# Patient Record
Sex: Male | Born: 1960 | Race: White | Hispanic: No | Marital: Married | Smoking: Never smoker
Health system: Southern US, Community
[De-identification: ages and names within clinical notes are randomized; demographics above are authoritative.]

## PROBLEM LIST (undated history)

## (undated) DIAGNOSIS — I2510 Atherosclerotic heart disease of native coronary artery without angina pectoris: Secondary | ICD-10-CM

## (undated) DIAGNOSIS — R51 Headache: Secondary | ICD-10-CM

## (undated) DIAGNOSIS — Z9289 Personal history of other medical treatment: Secondary | ICD-10-CM

## (undated) DIAGNOSIS — I471 Supraventricular tachycardia, unspecified: Secondary | ICD-10-CM

## (undated) DIAGNOSIS — I214 Non-ST elevation (NSTEMI) myocardial infarction: Secondary | ICD-10-CM

## (undated) DIAGNOSIS — R011 Cardiac murmur, unspecified: Secondary | ICD-10-CM

## (undated) DIAGNOSIS — I209 Angina pectoris, unspecified: Secondary | ICD-10-CM

## (undated) DIAGNOSIS — I10 Essential (primary) hypertension: Secondary | ICD-10-CM

## (undated) HISTORY — PX: COLONOSCOPY: SHX174

## (undated) HISTORY — DX: Non-ST elevation (NSTEMI) myocardial infarction: I21.4

## (undated) HISTORY — PX: OTHER SURGICAL HISTORY: SHX169

## (undated) HISTORY — DX: Atherosclerotic heart disease of native coronary artery without angina pectoris: I25.10

## (undated) HISTORY — DX: Personal history of other medical treatment: Z92.89

## (undated) HISTORY — PX: CARDIAC CATHETERIZATION: SHX172

---

## 2009-07-21 ENCOUNTER — Ambulatory Visit: Payer: Self-pay | Admitting: Radiology

## 2009-07-21 ENCOUNTER — Emergency Department (HOSPITAL_BASED_OUTPATIENT_CLINIC_OR_DEPARTMENT_OTHER): Admission: EM | Admit: 2009-07-21 | Discharge: 2009-07-21 | Payer: Self-pay | Admitting: Emergency Medicine

## 2010-10-30 LAB — URINE MICROSCOPIC-ADD ON

## 2010-10-30 LAB — URINALYSIS, ROUTINE W REFLEX MICROSCOPIC
Glucose, UA: NEGATIVE mg/dL
Leukocytes, UA: NEGATIVE
Nitrite: NEGATIVE
Specific Gravity, Urine: 1.025 (ref 1.005–1.030)
pH: 5.5 (ref 5.0–8.0)

## 2012-06-26 ENCOUNTER — Inpatient Hospital Stay (HOSPITAL_BASED_OUTPATIENT_CLINIC_OR_DEPARTMENT_OTHER)
Admission: EM | Admit: 2012-06-26 | Discharge: 2012-06-27 | DRG: 125 | Disposition: A | Discharge status: Other Acute Inpatient Hospital | Payer: Federal, State, Local not specified - PPO | Attending: Emergency Medicine | Admitting: Emergency Medicine

## 2012-06-26 ENCOUNTER — Emergency Department (HOSPITAL_BASED_OUTPATIENT_CLINIC_OR_DEPARTMENT_OTHER): Payer: Federal, State, Local not specified - PPO

## 2012-06-26 ENCOUNTER — Encounter (HOSPITAL_BASED_OUTPATIENT_CLINIC_OR_DEPARTMENT_OTHER): Payer: Self-pay | Admitting: Emergency Medicine

## 2012-06-26 DIAGNOSIS — I471 Supraventricular tachycardia: Secondary | ICD-10-CM

## 2012-06-26 DIAGNOSIS — I498 Other specified cardiac arrhythmias: Secondary | ICD-10-CM

## 2012-06-26 DIAGNOSIS — I10 Essential (primary) hypertension: Secondary | ICD-10-CM | POA: Diagnosis present

## 2012-06-26 DIAGNOSIS — I214 Non-ST elevation (NSTEMI) myocardial infarction: Secondary | ICD-10-CM

## 2012-06-26 DIAGNOSIS — I2510 Atherosclerotic heart disease of native coronary artery without angina pectoris: Principal | ICD-10-CM | POA: Diagnosis present

## 2012-06-26 DIAGNOSIS — R002 Palpitations: Secondary | ICD-10-CM | POA: Diagnosis present

## 2012-06-26 DIAGNOSIS — R61 Generalized hyperhidrosis: Secondary | ICD-10-CM | POA: Diagnosis present

## 2012-06-26 DIAGNOSIS — R079 Chest pain, unspecified: Secondary | ICD-10-CM

## 2012-06-26 DIAGNOSIS — Z8679 Personal history of other diseases of the circulatory system: Secondary | ICD-10-CM

## 2012-06-26 HISTORY — DX: Supraventricular tachycardia: I47.1

## 2012-06-26 HISTORY — DX: Cardiac murmur, unspecified: R01.1

## 2012-06-26 HISTORY — DX: Essential (primary) hypertension: I10

## 2012-06-26 HISTORY — DX: Supraventricular tachycardia, unspecified: I47.10

## 2012-06-26 LAB — COMPREHENSIVE METABOLIC PANEL
ALT: 12 U/L (ref 0–53)
Albumin: 4.4 g/dL (ref 3.5–5.2)
Calcium: 9.5 mg/dL (ref 8.4–10.5)
GFR calc Af Amer: 80 mL/min — ABNORMAL LOW (ref >90)
Glucose, Bld: 153 mg/dL — ABNORMAL HIGH (ref 70–99)
Sodium: 140 mEq/L (ref 135–145)
Total Protein: 7.8 g/dL (ref 6.0–8.3)

## 2012-06-26 LAB — TROPONIN I
Troponin I: <0.3 ng/mL (ref <0.30)
Troponin I: 1.95 ng/mL (ref <0.30)
Troponin I: 1.58 ng/mL (ref <0.30)

## 2012-06-26 LAB — TSH: TSH: 0.974 u[IU]/mL (ref 0.350–4.500)

## 2012-06-26 LAB — CBC
Hemoglobin: 15.8 g/dL (ref 13.0–17.0)
MCH: 30.7 pg (ref 26.0–34.0)
MCHC: 35.7 g/dL (ref 30.0–36.0)
Platelets: 269 10*3/uL (ref 150–400)
RDW: 12.1 % (ref 11.5–15.5)

## 2012-06-26 LAB — HEPARIN LEVEL (UNFRACTIONATED): Heparin Unfractionated: 0.37 IU/mL (ref 0.30–0.70)

## 2012-06-26 LAB — MAGNESIUM: Magnesium: 2.2 mg/dL (ref 1.5–2.5)

## 2012-06-26 MED ORDER — ADENOSINE 6 MG/2ML IV SOLN
INTRAVENOUS | Status: AC
  Administered 2012-06-26: 6 mg via INTRAVENOUS
  Filled 2012-06-26: qty 6

## 2012-06-26 MED ORDER — ONDANSETRON HCL 4 MG/2ML IJ SOLN: 4.0000 mg | Freq: Four times a day (QID) | INTRAMUSCULAR | Status: DC | PRN

## 2012-06-26 MED ORDER — HEPARIN (PORCINE) IN NACL 100-0.45 UNIT/ML-% IJ SOLN
12.0000 [IU]/kg/h | Freq: Once | INTRAMUSCULAR | Status: AC
  Administered 2012-06-26: 12 [IU]/kg/h via INTRAVENOUS
  Filled 2012-06-26: qty 250

## 2012-06-26 MED ORDER — ASPIRIN 81 MG PO CHEW: 81.0000 mg | CHEWABLE_TABLET | Freq: Every day | ORAL | Status: DC

## 2012-06-26 MED ORDER — METOPROLOL TARTRATE 25 MG PO TABS
25.0000 mg | ORAL_TABLET | Freq: Two times a day (BID) | ORAL | Status: DC
  Administered 2012-06-26 – 2012-06-27 (×3): 25 mg via ORAL
  Filled 2012-06-26 (×4): qty 1

## 2012-06-26 MED ORDER — METOPROLOL TARTRATE 25 MG PO TABS: 25.0000 mg | ORAL_TABLET | Freq: Two times a day (BID) | ORAL | Status: DC

## 2012-06-26 MED ORDER — ASPIRIN 81 MG PO CHEW
324.0000 mg | CHEWABLE_TABLET | Freq: Once | ORAL | Status: AC
  Administered 2012-06-26: 324 mg via ORAL
  Filled 2012-06-26: qty 4

## 2012-06-26 MED ORDER — HEPARIN BOLUS VIA INFUSION
4000.0000 [IU] | Freq: Once | INTRAVENOUS | Status: AC
  Administered 2012-06-26: 4000 [IU] via INTRAVENOUS

## 2012-06-26 MED ORDER — SODIUM CHLORIDE 0.9 % IJ SOLN: 3.0000 mL | INTRAMUSCULAR | Status: DC | PRN

## 2012-06-26 MED ORDER — SODIUM CHLORIDE 0.9 % IV SOLN: 250.0000 mL | INTRAVENOUS | Status: DC | PRN

## 2012-06-26 MED ORDER — NITROGLYCERIN 0.4 MG SL SUBL: 0.4000 mg | SUBLINGUAL_TABLET | SUBLINGUAL | Status: DC | PRN

## 2012-06-26 MED ORDER — SODIUM CHLORIDE 0.9 % IJ SOLN: 3.0000 mL | Freq: Two times a day (BID) | INTRAMUSCULAR | Status: DC

## 2012-06-26 MED ORDER — ASPIRIN 81 MG PO CHEW
324.0000 mg | CHEWABLE_TABLET | ORAL | Status: AC
  Administered 2012-06-27: 324 mg via ORAL
  Filled 2012-06-26: qty 4

## 2012-06-26 MED ORDER — METOPROLOL TARTRATE 50 MG PO TABS: 25.0000 mg | ORAL_TABLET | Freq: Two times a day (BID) | ORAL | Status: DC

## 2012-06-26 MED ORDER — ACETAMINOPHEN 325 MG PO TABS: 650.0000 mg | ORAL_TABLET | ORAL | Status: DC | PRN

## 2012-06-26 MED ORDER — ADENOSINE 6 MG/2ML IV SOLN
6.0000 mg | Freq: Once | INTRAVENOUS | Status: AC
  Administered 2012-06-26: 6 mg via INTRAVENOUS

## 2012-06-26 MED ORDER — SODIUM CHLORIDE 0.9 % IV SOLN
1.0000 mL/kg/h | INTRAVENOUS | Status: DC
Start: 2012-06-27 — End: 2012-06-27
  Administered 2012-06-27 (×2): 1 mL/kg/h via INTRAVENOUS

## 2012-06-26 MED ORDER — ASPIRIN EC 81 MG PO TBEC
81.0000 mg | DELAYED_RELEASE_TABLET | Freq: Every day | ORAL | Status: DC
  Filled 2012-06-26: qty 1

## 2012-06-26 MED ORDER — HEPARIN (PORCINE) IN NACL 100-0.45 UNIT/ML-% IJ SOLN
12.0000 [IU]/kg/h | INTRAMUSCULAR | Status: DC
  Administered 2012-06-26 – 2012-06-27 (×2): 12 [IU]/kg/h via INTRAVENOUS
  Filled 2012-06-26 (×3): qty 250

## 2012-06-26 NOTE — Progress Notes (Signed)
ANTICOAGULATION CONSULT NOTE - Initial Consult  Pharmacy Consult for heparin Indication: chest pain/ACS  No Known Allergies  Patient Measurements: Height: 5\' 11"  (180.3 cm) Weight: 175 lb 10.6 oz (79.68 kg) IBW/kg (Calculated) : 75.3  Heparin Dosing Weight: 80 kg  Vital Signs: Temp: 97.9 F (36.6 C) (11/28 1100) Temp src: Oral (11/28 1100) BP: 131/91 mmHg (11/28 1100) Pulse Rate: 94  (11/28 1100)  Labs:  Basename 06/26/12 0905 06/26/12 0605  HGB -- 15.8  HCT -- 44.2  PLT -- 269  APTT -- --  LABPROT -- --  INR -- --  HEPARINUNFRC -- --  CREATININE -- 1.20  CKTOTAL -- --  CKMB -- --  TROPONINI 0.49* <0.30    Estimated Creatinine Clearance: 79.3 ml/min (by C-G formula based on Cr of 1.2).   Medical History: Past Medical History  Diagnosis Date  . Tachycardia   . Heart murmur   . Hypertension     per pt dx 20 years but has not had any meds in 20 yrs per md    Medications:  Scheduled:    . [COMPLETED] adenosine (ADENOCARD) IV  6 mg Intravenous Once  . [COMPLETED] aspirin  324 mg Oral Once  . aspirin EC  81 mg Oral Daily  . [COMPLETED] heparin  12 Units/kg/hr Intravenous Once  . [COMPLETED] heparin  4,000 Units Intravenous Once  . metoprolol tartrate  25 mg Oral BID  . [DISCONTINUED] metoprolol tartrate  25 mg Oral BID   Infusions:    Assessment: 51 yo male admitted with chest pain. Was at [facility] and was given a bolus of heparin 4000 units iv x1 at ~1035 and started on the drip at 12 units/kg/hr. H/H 15.8/44.2; Plt 269; troponin 0.49.    Goal of Therapy:  Heparin level 0.3-0.7 units/ml Monitor platelets by anticoagulation protocol: Yes   Plan:  1) Continue heparin drip at 12 units/kg/hr since since the patient has been on heparin drip for almost 4 hours 2) Check a heparin drip at 1630 today 3) Daily heparin level and CBC    Yechiel Erny, Tsz-Yin 06/26/2012,2:03 PM

## 2012-06-26 NOTE — Progress Notes (Signed)
ANTICOAGULATION CONSULT NOTE - Initial Consult  Pharmacy Consult for heparin Indication: chest pain/ACS  No Known Allergies  Patient Measurements: Height: 5\' 11"  (180.3 cm) Weight: 175 lb 10.6 oz (79.68 kg) IBW/kg (Calculated) : 75.3  Heparin Dosing Weight: 80 kg  Vital Signs: Temp: 97.9 F (36.6 C) (11/28 1100) Temp src: Oral (11/28 1100) BP: 115/70 mmHg (11/28 1515) Pulse Rate: 108  (11/28 1515)  Labs:  Basename 06/26/12 1603 06/26/12 1430 06/26/12 0905 06/26/12 0605  HGB -- -- -- 15.8  HCT -- -- -- 44.2  PLT -- -- -- 269  APTT -- -- -- --  LABPROT -- -- -- --  INR -- -- -- --  HEPARINUNFRC 0.37 -- -- --  CREATININE -- -- -- 1.20  CKTOTAL -- -- -- --  CKMB -- -- -- --  TROPONINI -- 1.95* 0.49* <0.30    Estimated Creatinine Clearance: 79.3 ml/min (by C-G formula based on Cr of 1.2).   Medical History: Past Medical History  Diagnosis Date  . Tachycardia   . Heart murmur   . Hypertension     per pt dx 20 years but has not had any meds in 20 yrs per md    Medications:  Scheduled:     . [COMPLETED] adenosine (ADENOCARD) IV  6 mg Intravenous Once  . [COMPLETED] aspirin  324 mg Oral Once  . aspirin EC  81 mg Oral Daily  . [COMPLETED] heparin  12 Units/kg/hr Intravenous Once  . [COMPLETED] heparin  4,000 Units Intravenous Once  . metoprolol tartrate  25 mg Oral BID  . [DISCONTINUED] metoprolol tartrate  25 mg Oral BID   Infusions:     . heparin 12 Units/kg/hr (06/26/12 1516)    Assessment: 51 yo male admitted with chest pain likely due to SVT. He is on IV heparin and the 6 hr heparin level is therapeutic on 950 units/hr. Pending decision on stress test vs. cardiac cath. No bleeding noted per chart.  Goal of Therapy:  Heparin level 0.3-0.7 units/ml Monitor platelets by anticoagulation protocol: Yes   Plan:  1) Continue heparin drip at 950 units/hr 2) f/u AM heparin level and CBC 3) f/u plans of treatment.   Bayard Hugger, PharmD, BCPS  Clinical  Pharmacist  Pager: [phone]  06/26/2012,5:59 PM

## 2012-06-26 NOTE — H&P (Addendum)
History and Physical  Patient ID: Julian White MRN: [id number], SOB: Feb 22, 1961 51 y.o. Date of Encounter: 06/26/2012, 6:40 PM  Primary Physician: No primary provider on file. Primary Cardiologist: Nicholes Mango, MD (new)   Chief Complaint: Tachypalpitations, chest pain   HPI: 51 year old male, with no prior history of heart disease, who initially presented to [facility] ER with complaint of palpitations and chest pain. He was found to be in SVT rhythm at approximately 190 bpm (EKG currently unavailable), and was successfully treated with 6 mg of adenosine.   He awoke at 3 AM this morning with sensation of heart racing, but also with severe CP (6/10), with associated dyspnea, diaphoresis, and lightheadedness. He reported some radiation to the back and right shoulder, but not down the left arm. He presented with no definite ST changes on EKG; however, cardiac markers notable for a troponin I of 0.49. He was treated with 4 baby ASA and started on IV heparin.  Patient presents with several year history of occasional tachycardia palpitations, occurring every 3-4 months. He has noted some mild associated CP with these episodes, but never as intense as what he experienced this morning. He also presents with no antecedent history of exertional CP or DOE.  Patient denies any prior formal cardiac evaluation or diagnostic testing.   Past Medical History  Diagnosis Date  . Tachycardia   . Heart murmur   . Hypertension     per pt dx 20 years but has not had any meds in 20 yrs per md      Surgical History: History reviewed. No pertinent past surgical history.   Home Meds: Prior to Admission medications   Medication Sig Start Date End Date Taking? Authorizing Provider  aspirin 81 MG chewable tablet Chew 1 tablet (81 mg total) by mouth daily. 06/26/12   Tobin Chad, MD  metoprolol (LOPRESSOR) 50 MG tablet Take 0.5 tablets (25 mg total) by mouth 2 (two) times daily. 06/26/12   Tobin Chad, MD    Allergies: No Known Allergies  History   Social History  . Marital Status: Married    Spouse Name: N/A    Number of Children: N/A  . Years of Education: N/A   Occupational History  . Not on file.   Social History Main Topics  . Smoking status: Never Smoker   . Smokeless tobacco: Not on file  . Alcohol Use: No  . Drug Use:   . Sexually Active:    Other Topics Concern  . Not on file   Social History Narrative  . No narrative on file     Family History  Problem Relation Age of Onset  . CAD Brother     s/p MI in his 62s  . CAD Father     s/p CABG at 2    Review of Systems: General: negative for chills, fever, night sweats or weight changes.  Cardiovascular: negative for chest pain, edema, orthopnea, palpitations, paroxysmal nocturnal dyspnea, shortness of breath or dyspnea on exertion Dermatological: negative for rash Respiratory: negative for cough or wheezing Urologic: negative for hematuria Abdominal: negative for nausea, vomiting, diarrhea, bright red blood per rectum, melena, or hematemesis Neurologic: negative for visual changes, syncope, or dizziness All other systems reviewed and are otherwise negative except as noted above.  Labs:   Lab Results  Component Value Date   WBC 11.7* 06/26/2012   HGB 15.8 06/26/2012   HCT 44.2 06/26/2012   MCV 85.8 06/26/2012   PLT 269  06/26/2012     Lab 06/26/12 0605  NA 140  K 3.8  CL 101  CO2 28  BUN 19  CREATININE 1.20  CALCIUM 9.5  PROT 7.8  BILITOT 0.5  ALKPHOS 102  ALT 12  AST 18  GLUCOSE 153*    Basename 06/26/12 1430 06/26/12 0905 06/26/12 0605  CKTOTAL -- -- --  CKMB -- -- --  TROPONINI 1.95* 0.49* <0.30   No results found for this basename: CHOL,  HDL,  LDLCALC,  TRIG   No results found for this basename: DDIMER    Radiology/Studies:  Dg Chest 2 View  06/26/2012  *RADIOLOGY REPORT*  Clinical Data: Chest pain and tachycardia; shortness of breath.  CHEST - 2 VIEW   Comparison: None.  Findings: The lungs are well-aerated and clear.  There is no evidence of focal opacification, pleural effusion or pneumothorax.  The heart is normal in size; the mediastinal contour is within normal limits.  No acute osseous abnormalities are seen.  IMPRESSION: No acute cardiopulmonary process seen.   Original Report Authenticated By: Tonia Ghent, M.D.      EKG: NSR 87 bpm; normal axis; no ischemic changes, on arrival  Physical Exam:  General: 51 yom; Well developed, well nourished, in no acute distress. Head: Normocephalic, atraumatic, sclera non-icteric, no xanthomas, nares are without discharge.  Neck: Negative for carotid bruits. JVD not elevated. Lungs: Clear bilaterally to auscultation without wheezes, rales, or rhonchi. Breathing is unlabored. Heart: RRR with S1 S2. No murmurs, rubs, or gallops appreciated. Abdomen: Soft, non-tender, non-distended with normoactive bowel sounds. No hepatomegaly. No rebound/guarding. No obvious abdominal masses. Msk:  Strength and tone appear normal for age. Extremities: No clubbing or cyanosis. No edema.  Distal pedal pulses are 2+ and equal bilaterally. Neuro: Alert and oriented X 3. Moves all extremities spontaneously. Psych:  Responds to questions appropriately with a normal affect.     ASSESSMENT AND PLAN:  1 SVT  - Successfully treated with 6 mg adenosine   - longstanding h/o tachypalpitaions  2 NSTEMI (type II)  - in setting of above  3 Multiple CRFs  - h/o HTN  - FH  - age  PLAN: Patient will be admitted for continued close monitoring on telemetry, to rule out recurrent tachyarrhythmia. Serial cardiac markers will be cycled. Will check TSH and Mg levels. FLP in AM. Medical therapy to consist of ASA, beta blocker, and IV heparin. We will order a 2-D echocardiogram for assessment of LVF, and rule out of underlying structural abnormalities. We'll request a formal EP evaluation regarding feasibility of RF ablation. Of  note, patient to consider the option of a stress test for risk stratification, versus a diagnostic cardiac catheterization for definitive exclusion of CAD.  Signed, SERPE, EUGENE PA-C 06/26/2012, 6:40 PM   Patient seen and examined independently. Gene Serpe, PA-C note reviewed carefully - agree with his assessment and plan. I have edited the note based on my findings.   51 y/o male with long h/o of palpitations/SVT. Now admitted with recurrent, prolonged episode of SVT with HR 190 associated with demand NSTEMI likely due to high rate of SVT. At baseline, very active without ischemic sx. Discussed need for echo and ablation. Given NSTEMI, we also discussed role of further testing (stress testing, cath, cardiac CT) for underlying CAD particularly in light of his FHX with brother who had MI in 16s. We discussed risks and advantages of each modality. Currently he is leaning toward cath but will discuss with his family prior  to making a final decision. Agree with ASA, b-blocker and heparin. Once echo and ischemic evaluation complete can be discharged and return for ablation as outpatient.   Anayia Eugene,MD 8:38 PM   Addendum - f/u troponin now 1.9. Patient and family wanting to pursue cardiac catheterization. Will arrange for tomorrow.   Truman Hayward 8:39 PM

## 2012-06-26 NOTE — ED Notes (Signed)
2956 6mg  adenosine pushed, pt sitting in bed alert and oriented, follows commands, procedure explained , meds pushed, flushed with ns, pt hr decreased to 110-120, pt alert and oriented, tolerated well

## 2012-06-26 NOTE — ED Notes (Signed)
Pt awoke around 3 am with left sided chest pain the radiates to back of neck

## 2012-06-26 NOTE — ED Notes (Signed)
Pt arrived c/o chest pain that awoke him from his sleep. Upon assesment, pt was noted to be in svt, heart rate of 190-205, pt alert oriented, sitting in bed talking, denies sob, denies n/v, md at bedside

## 2012-06-26 NOTE — ED Notes (Signed)
Sherry with lab called panic troponin 0.49 notified Dr Linwood Dibbles

## 2012-06-26 NOTE — ED Provider Notes (Signed)
History     CSN: [id number]  Arrival date & time 06/26/12  0549   None     Chief Complaint  Patient presents with  . Chest Pain    (Consider location/radiation/quality/duration/timing/severity/associated sxs/prior treatment) HPI Comments: [name] presents with his wife for evaluation.  He reports waking from sleep at about 0300 with severe left-sided chest pain that radiated into his neck and back.  The sharp pain soon subsided into a dull discomfort but it persisted for quite some time.  This prompted him to seek attention.  On arrival to the ER, he reports he can now feel his heart racing.  He denies any shortness of breath and only reports a vague ongoing discomfort.    He denies any prior history of chest pain but has been having palpitations or a racing heart beat intermittently (sporadicallly - every few weeks) for several years.  It has never persisted and he has never sought medical attention related to this condition.  He denies alcohol, caffeine, stimulant, or drug abuse.  He state his father had a CABG and he has 2 brothers that had heart attacks or were diagnosed with CAD in their early 91s.  The history is provided by the patient. No language interpreter was used.    Past Medical History  Diagnosis Date  . Tachycardia   . Heart murmur   . Hypertension     per pt dx 20 years but has not had any meds in 20 yrs per md    History reviewed. No pertinent past surgical history.  History reviewed. No pertinent family history.  History  Substance Use Topics  . Smoking status: Never Smoker   . Smokeless tobacco: Not on file  . Alcohol Use: No      Review of Systems  Constitutional: Positive for diaphoresis. Negative for fever, chills, activity change, appetite change and fatigue.  HENT: Negative.   Eyes: Negative.   Respiratory: Positive for chest tightness. Negative for cough, shortness of breath and wheezing.   Cardiovascular: Positive for palpitations.  Negative for chest pain and leg swelling.  Gastrointestinal: Negative for nausea, vomiting, abdominal pain and diarrhea.  Genitourinary: Negative.   Musculoskeletal: Negative.   Skin: Negative.   Neurological: Negative.   Hematological: Negative.   Psychiatric/Behavioral: Negative.     Allergies  Review of patient's allergies indicates no known allergies.  Home Medications  No current outpatient prescriptions on file.  BP 121/83  Pulse 103  Temp 98 F (36.7 C)  Physical Exam  Nursing note and vitals reviewed. Constitutional: He is oriented to person, place, and time. He appears well-developed and well-nourished. No distress.  HENT:  Head: Normocephalic and atraumatic.  Right Ear: External ear normal.  Nose: Nose normal.  Mouth/Throat: Oropharynx is clear and moist. No oropharyngeal exudate.  Eyes: Conjunctivae normal are normal. Pupils are equal, round, and reactive to light. Right eye exhibits no discharge. Left eye exhibits no discharge. No scleral icterus.  Neck: Normal range of motion. Neck supple. No JVD present. No tracheal deviation present.  Cardiovascular: Regular rhythm, S1 normal, S2 normal, normal heart sounds, intact distal pulses and normal pulses.  Tachycardia present.  Exam reveals no gallop, no distant heart sounds, no friction rub and no decreased pulses.   No murmur heard.      Rhythm was too fast to characterize by auscultation initially.  After adenosine, note RRR [id number].  Pulmonary/Chest: Effort normal and breath sounds normal. No stridor. No respiratory distress. He has no wheezes.  He has no rales. He exhibits no tenderness.  Abdominal: Soft. Bowel sounds are normal. He exhibits no distension and no mass. There is no tenderness. There is no rebound and no guarding.  Lymphadenopathy:    He has no cervical adenopathy.  Neurological: He is alert and oriented to person, place, and time. No cranial nerve deficit.  Skin: Skin is warm and dry. No rash noted. He  is not diaphoretic. No erythema. No pallor.  Psychiatric: He has a normal mood and affect. His behavior is normal.    ED Course  Procedures (including critical care time)   Labs Reviewed  CBC  COMPREHENSIVE METABOLIC PANEL  TROPONIN I   No results found.   No diagnosis found.   Date: 06/26/2012 @ 0601  Rate: 190 bpm  Rhythm: supraventricular tachycardia (SVT)  QRS Axis: normal  Intervals:    ST/T Wave abnormalities: nonspecific ST changes  Conduction Disutrbances:none  Narrative Interpretation:   Old EKG Reviewed: none available   Date: 06/26/2012 @0613  s/p adenosine push x 1  Rate: 94 bpm  Rhythm: sinus  QRS Axis: normal  Intervals: normal  ST/T Wave abnormalities: ST depressions diffusely  Conduction Disutrbances:none  Narrative Interpretation:   Old EKG Reviewed: changes noted        MDM  Pt presents for evaluation of chest pain.  He developed SVT on arrival which has converted to sinus rhythm after administering IVP adenosine.  He reports some vague ongoing discomfort, note stable vital signs currently, NAD.  Pt reports a strong family hx for early CAD that includes 2 brothers and his father with documented CAD in their 34s.  Repeat EKG does show some diffuse ST depressions but this finding could be secondary to the sustained SVT.  He reports having periods of palpitations for several years that have always self-resolved.  Will obtain basic cardiac labs and a chest x-ray.  As the results become available, will contact the hospitalist service for admission for further mgmnt and testing.   0735.  Pt stable, NAD.  He is completely pain free.  CXR and 1st trop are unremarkable.  I discussed his evaluation with the on-call provider for the [facility] cardiology group.  Plan a repeat 3 hr trop and EKG.  If significantly abnormal, he will be admitted for further testing.  If unchanged, will discharge home on metoprolol 25 mg bid and asa 81 mg po daily to follow-up as an  outpyt.  Pt signed over to Dr. Lynelle Doctor pending the 2nd trop at [phone].   CRITICAL CARE Performed by: Dana Allan T   Total critical care time: 35  Critical care time was exclusive of separately billable procedures and treating other patients.  Critical care was necessary to treat or prevent imminent or life-threatening deterioration.  Critical care was time spent personally by me on the following activities: development of treatment plan with patient and/or surrogate as well as nursing, discussions with consultants, evaluation of patient's response to treatment, examination of patient, obtaining history from patient or surrogate, ordering and performing treatments and interventions, ordering and review of laboratory studies, ordering and review of radiographic studies, pulse oximetry and re-evaluation of patient's condition.           Julian Chad, MD 06/26/12 [phone]

## 2012-06-26 NOTE — ED Notes (Signed)
Family at bedside.pt talking to family, denies sob, cont. Back pain

## 2012-06-26 NOTE — ED Provider Notes (Signed)
Repeat EKG   Rate: 87  Rhythm: normal sinus rhythm  QRS Axis: normal  Intervals: normal  ST/T Wave abnormalities: normal  Conduction Disutrbances:none  Narrative Interpretation:   Old EKG Reviewed: none available  Repeat troponin is elevated.  Pt remains symptom free.  Will discuss with [place] cardiology since his troponin level is increasing and he has a strong family history.  10:20 AM Discussed with Dr Dietrich Pates.  Will transfer pt to [place] for admission.  Pt remains symptoms free at this time.  Updated findings with patient and his family.  Celene Kras, MD 06/26/12 1021

## 2012-06-26 NOTE — ED Notes (Signed)
Called carelink to page [facility] per Dr. Lynelle Doctor request.-MT

## 2012-06-26 NOTE — Progress Notes (Signed)
Dr. Gala Romney informed of Troponin 1.95; continuing current orders. Julian White

## 2012-06-27 ENCOUNTER — Encounter (HOSPITAL_COMMUNITY): Payer: Self-pay | Admitting: Internal Medicine

## 2012-06-27 ENCOUNTER — Encounter (HOSPITAL_COMMUNITY)
Admission: EM | Disposition: A | Discharge status: Other Acute Inpatient Hospital | Payer: Self-pay | Source: Home / Self Care

## 2012-06-27 DIAGNOSIS — I214 Non-ST elevation (NSTEMI) myocardial infarction: Secondary | ICD-10-CM

## 2012-06-27 DIAGNOSIS — I471 Supraventricular tachycardia, unspecified: Secondary | ICD-10-CM

## 2012-06-27 DIAGNOSIS — R011 Cardiac murmur, unspecified: Secondary | ICD-10-CM | POA: Insufficient documentation

## 2012-06-27 HISTORY — PX: LEFT HEART CATHETERIZATION WITH CORONARY ANGIOGRAM: SHX5451

## 2012-06-27 LAB — PROTIME-INR
INR: 1.07 (ref 0.00–1.49)
Prothrombin Time: 13.8 seconds (ref 11.6–15.2)

## 2012-06-27 LAB — LIPID PANEL
LDL Cholesterol: 128 mg/dL — ABNORMAL HIGH (ref 0–99)
Total CHOL/HDL Ratio: 4.4 RATIO

## 2012-06-27 LAB — TROPONIN I: Troponin I: 1.51 ng/mL (ref <0.30)

## 2012-06-27 LAB — CBC
HCT: 38.9 % — ABNORMAL LOW (ref 39.0–52.0)
Hemoglobin: 13.2 g/dL (ref 13.0–17.0)
MCH: 29.9 pg (ref 26.0–34.0)
MCHC: 33.9 g/dL (ref 30.0–36.0)

## 2012-06-27 SURGERY — LEFT HEART CATHETERIZATION WITH CORONARY ANGIOGRAM
Anesthesia: LOCAL

## 2012-06-27 MED ORDER — LIDOCAINE HCL (PF) 1 % IJ SOLN
INTRAMUSCULAR | Status: AC
  Filled 2012-06-27: qty 30

## 2012-06-27 MED ORDER — HEPARIN SODIUM (PORCINE) 1000 UNIT/ML IJ SOLN
INTRAMUSCULAR | Status: AC
  Filled 2012-06-27: qty 1

## 2012-06-27 MED ORDER — METOPROLOL TARTRATE 50 MG PO TABS: 50.0000 mg | ORAL_TABLET | ORAL | Status: DC | PRN

## 2012-06-27 MED ORDER — MIDAZOLAM HCL 2 MG/2ML IJ SOLN
INTRAMUSCULAR | Status: AC
  Filled 2012-06-27: qty 2

## 2012-06-27 MED ORDER — FENTANYL CITRATE 0.05 MG/ML IJ SOLN
INTRAMUSCULAR | Status: AC
  Filled 2012-06-27: qty 2

## 2012-06-27 MED ORDER — SIMVASTATIN 20 MG PO TABS
20.0000 mg | ORAL_TABLET | Freq: Every day | ORAL | Status: DC
  Administered 2012-06-27: 20 mg via ORAL
  Filled 2012-06-27: qty 1

## 2012-06-27 MED ORDER — SIMVASTATIN 20 MG PO TABS: 20.0000 mg | ORAL_TABLET | Freq: Every day | ORAL | Status: DC

## 2012-06-27 MED ORDER — HEPARIN (PORCINE) IN NACL 2-0.9 UNIT/ML-% IJ SOLN
INTRAMUSCULAR | Status: AC
  Filled 2012-06-27: qty 1000

## 2012-06-27 MED ORDER — ONDANSETRON HCL 4 MG/2ML IJ SOLN: 4.0000 mg | Freq: Four times a day (QID) | INTRAMUSCULAR | Status: DC | PRN

## 2012-06-27 MED ORDER — SODIUM CHLORIDE 0.9 % IV SOLN
INTRAVENOUS | Status: AC
  Administered 2012-06-27: 09:00:00 via INTRAVENOUS

## 2012-06-27 MED ORDER — ACETAMINOPHEN 325 MG PO TABS: 650.0000 mg | ORAL_TABLET | ORAL | Status: DC | PRN

## 2012-06-27 MED ORDER — MIDAZOLAM HCL 2 MG/2ML IJ SOLN
INTRAMUSCULAR | Status: AC
Start: 2012-06-27 — End: 2012-06-27
  Filled 2012-06-27: qty 2

## 2012-06-27 MED ORDER — NITROGLYCERIN 0.2 MG/ML ON CALL CATH LAB
INTRAVENOUS | Status: AC
  Filled 2012-06-27: qty 1

## 2012-06-27 MED ORDER — VERAPAMIL HCL 2.5 MG/ML IV SOLN
INTRAVENOUS | Status: AC
  Filled 2012-06-27: qty 2

## 2012-06-27 NOTE — Progress Notes (Signed)
Assessment unchanged. Discussed D/C instructions with pt and family. Pt verbalized understanding.  RX given to pt. Tele D/C'd and IV removed. Pt left via foot accompanied by RN.

## 2012-06-27 NOTE — Discharge Summary (Signed)
Discharge Summary   Patient ID: Julian White,  MRN: [id number], DOB/AGE: 1960/10/23 51 y.o.  Admit date: 06/26/2012 Discharge date: 06/27/2012  Primary Physician: No primary provider on file. Primary Cardiologist: Dr. Gala Romney (new)  Discharge Diagnoses Principal Problem:  *NSTEMI (non-ST elevated myocardial infarction) Active Problems:  SVT (supraventricular tachycardia)   Allergies No Known Allergies  Diagnostic Studies/Procedures    PA/LATERAL CHEST X-RAY - 06/26/12  CHEST - 2 VIEW  Comparison: None.  Findings: The lungs are well-aerated and clear. There is no  evidence of focal opacification, pleural effusion or pneumothorax.  The heart is normal in size; the mediastinal contour is within  normal limits. No acute osseous abnormalities are seen.  IMPRESSION:  No acute cardiopulmonary process seen.  LEFT HEART CATHETERIZATION - 06/27/12  Findings:  Ao Pressure:117/85 (101)  LV Pressure: 119/4/8  There was no signficant gradient across the aortic valve on pullback.  Left main: Distal 20%  LAD: Very large. Wraps apex. Proximal and mid 20% tubular lesions with small intramyocardial bridge in the midsection with no flow limitation. Very large branching 1st diagonal  LCX: Co-dominant. Normal  RCA: Co-dominant. Moderate-sized. Very mild luminal plaquing.  LV-gram done in the RAO projection: Ejection fraction = 55% no wall motion abnormalities  Assessment:  1. Very mild luminal CAD  2. Normal LV function   TRANSTHORACIC ECHOCARDIOGRAM - 06/27/12  Left ventricle: Wall thickness was increased in a pattern of mild LVH. Systolic function was vigorous. The estimated ejection fraction was in the range of 65% to 70%.  Hospital Course   Julian White is a 51yo male who was hospitalized at [facility] on 06/26/12 for tachy-palpitations and chest pain. He has no prior history of cardiac disease. He has a history of HLD and family history of premature CAD (brother  MI 12s). He presented to [facility] ED with c/o tachy-palpitations and chest pain. EKG there revealed SVT (rate 190). He was given adenosine 6mg  x 1 with successful restoration to NSR. He awoke shortly after with tachy-palpitations, 6/10 chest pain, dyspnea, diaphoresis and lightheadedness with radiation to his back and R shoulder. He was transferred to [facility] with no definite ST changes on EKG, and a mild troponin-I elevation of 0.49. He was given low-dose ASA x 4 and started on heparin IV. He notes these tachy-palpitation episodes have occurred for several years every 3-4 months w/ occasional associated chest pain. No prior cardiac work-up. Very active without chest pain aggravation.   History of Present Illness  Given NSTEMI, Dr. Gala Romney discussed the role of further testing (stress testing, cath, cardiac CT) for underlying CAD given his cardiac RFs. This was initially contemplated, however a subsequent troponin returned more elevated at 1.9, and the decision was made to pursue cardiac catheterization. He was informed, consented and prepped for cath which revealed mild luminal CAD and normal LV function. He tolerated the procedure well without complications. EP consult was made and the decision was made to pursue RFCA. This will be scheduled as an outpatient. He underwent a TTE as above revealing LVEF 65-70% and mild LVH. He was deemed stable for discharge by Dr. Gala Romney. He will be prescribed Lopressor PRN for palpitations. He will continue low-dose ASA. He will continue daily statin. He will follow-up with EP as an outpatient and be scheduled for RFCA. This information, including post-cath instructions, has been clearly outlined in the discharge AVS.   Discharge Vitals:  Blood pressure 119/83, pulse 79, temperature 98.3 F (36.8 C), temperature source Oral, resp.  rate 17, height 5\' 11"  (1.803 m), weight 77.837 kg (171 lb 9.6 oz), SpO2 97.00%.   Labs: Recent Labs  Basename 06/27/12 0530 06/26/12 0605    WBC 7.7 11.7*   HGB 13.2 15.8   HCT 38.9* 44.2   MCV 88.0 85.8   PLT 189 269    Lab 06/26/12 0605  NA 140  K 3.8  CL 101  CO2 28  BUN 19  CREATININE 1.20  CALCIUM 9.5  PROT 7.8  BILITOT 0.5  ALKPHOS 102  ALT 12  AST 18  AMYLASE --  LIPASE --  GLUCOSE 153*    Recent Labs  Basename 06/27/12 0215 06/26/12 2026 06/26/12 1430   CKTOTAL -- -- --   CKMB -- -- --   CKMBINDEX -- -- --   TROPONINI 1.51* 1.58* 1.95*   Recent Labs  Basename 06/27/12 0530   CHOL 189   HDL 43   LDLCALC 128*   TRIG 88   CHOLHDL 4.4   LDLDIRECT --    Basename 06/26/12 1603  TSH 0.974  T4TOTAL --  T3FREE --  THYROIDAB --    Disposition:   Follow-up Information    Follow up with [facility] Schedule an appointment as soon as possible for a visit in 5 days. (please call to arrange an establish care appointment.)    Contact information:   [address] [place] [address]         Discharge Medications:    Medication List     As of 06/27/2012  6:25 PM    START taking these medications         aspirin 81 MG chewable tablet   Chew 1 tablet (81 mg total) by mouth daily.      metoprolol 50 MG tablet   Commonly known as: LOPRESSOR   Take 1 tablet (50 mg total) by mouth as needed (palpitations).      simvastatin 20 MG tablet   Commonly known as: ZOCOR   Take 1 tablet (20 mg total) by mouth daily at 6 PM.          Where to get your medications    These are the prescriptions that you need to pick up. We sent them to a specific pharmacy, so you will need to go there to get them.   [id number] - [place], [place] - [address]    [address] [place] [address]    Phone: [phone]        simvastatin 20 MG tablet         You may get these medications from any pharmacy.         aspirin 81 MG chewable tablet   metoprolol 50 MG tablet           Outstanding Labs/Studies: None  Duration of Discharge  Encounter: Greater than 30 minutes including physician time.  Signed, R. Hurman Horn, PA-C 06/27/2012, 6:25 PM

## 2012-06-27 NOTE — Progress Notes (Signed)
*  PRELIMINARY RESULTS* Echocardiogram 2D Echocardiogram has been performed.  Julian White 06/27/2012, 1:29 PM

## 2012-06-27 NOTE — Consult Note (Signed)
ELECTROPHYSIOLOGY CONSULT NOTE    Patient ID: Julian White MRN: [id number], DOB/AGE: Nov 24, 1960 51 y.o.  Admit date: 06/26/2012 Date of Consult: 06-27-2012  Primary Cardiologist: Nicholes Mango (new)  Reason for Consultation: SVT  HPI:  [name] White is a 51 year old male who presented to the ER yesterday with sustained SVT.  EP has been asked to evaluate for treatment options.  Past medical history is significant for hypertension that was treated 20 years ago, but he is not currently on medications.  He awoke at 3AM yesterday morning with palpitations.  When they did not resolve on their own, he presented to the ER where he was treated with 6mg  of Adenosine which restored sinus rhythm.  His cardiac enzymes were positive with a peak troponin of 1.58.  He was admitted for ischemic evaluation and further work up (cardiac catheterization scheduled for today).  He states he has had palpitations for the last several years.  His episodes occur on a monthly basis.  They usually last 10-15 minutes and resolve spontaneously.  He reports shortness of breath with his spells.  No chest pain.  They are FROG negative and diuretic negative.  He denies syncope, pre-syncope.  Does report some lightheadedness during episodes.  He has not been placed on any medications in the past for his palpitations.  He desires definitive therapy.   Initial EKG upon presentation demonstrates a short RP tachycardia at a rate of 190bpm-- likely AVNRT.   ROS is negative except as outlined above.     Past Medical History  Diagnosis Date  . SVT (supraventricular tachycardia)   . Heart murmur   . Hypertension     per pt dx 20 years but has not had any meds in 20 yrs per md     Surgical History: History reviewed. No pertinent past surgical history.   No prescriptions prior to admission    Inpatient Medications:    . aspirin EC  81 mg Oral Daily  . metoprolol tartrate  25 mg Oral BID    Allergies: No Known  Allergies  History   Social History  . Marital Status: Married    Number of Children: 3  . Years of Education: N/A   Occupational History  . HR supervisor for the [employer]   Social History Main Topics  . Smoking status: Never Smoker   . Smokeless tobacco: Not on file  . Alcohol Use: No  . Drug Use: No    Family History  Problem Relation Age of Onset  . CAD Brother     s/p MI in his 45s  . CAD Father     s/p CABG at 70     BP 119/83  Pulse 79  Temp 98.3 F (36.8 C) (Oral)  Resp 17  Ht 5\' 11"  (1.803 m)  Wt 171 lb 9.6 oz (77.837 kg)  BMI 23.93 kg/m2  SpO2 97% Alert and oriented in no acute distress HENT- normal Eyes- EOMI, without scleral icterus Skin- warm and dry; without rashes LN-neg Neck- supple without thyromegaly, JVP-flat, carotids brisk and full without bruits Back-without CVAT or kyphosis Lungs-clear to auscultation CV-Regular rate and rhythm, nl S1 and S2, no murmurs gallops or rubs, S4-absent Abd-soft with active bowel sounds; no midline pulsation or hepatomegaly Pulses-intact femoral and distal MKS-without gross deformity Neuro- Ax O, CN3-12 intact, grossly normal motor and sensory function Affect engaging  Labs:   Lab Results  Component Value Date   WBC 7.7 06/27/2012   HGB 13.2  06/27/2012   HCT 38.9* 06/27/2012   MCV 88.0 06/27/2012   PLT 189 06/27/2012     Lab 06/26/12 0605  NA 140  K 3.8  CL 101  CO2 28  BUN 19  CREATININE 1.20  CALCIUM 9.5  PROT 7.8  BILITOT 0.5  ALKPHOS 102  ALT 12  AST 18  GLUCOSE 153*   Lab Results  Component Value Date   TROPONINI 1.51* 06/27/2012   Lab Results  Component Value Date   CHOL 189 06/27/2012   Lab Results  Component Value Date   HDL 43 06/27/2012   Lab Results  Component Value Date   LDLCALC 128* 06/27/2012   Lab Results  Component Value Date   TRIG 88 06/27/2012   Lab Results  Component Value Date   CHOLHDL 4.4 06/27/2012     Radiology/Studies: Dg Chest 2 View  06/26/2012  *RADIOLOGY REPORT*  Clinical Data: Chest pain and tachycardia; shortness of breath.  CHEST - 2 VIEW  Comparison: None.  Findings: The lungs are well-aerated and clear.  There is no evidence of focal opacification, pleural effusion or pneumothorax.  The heart is normal in size; the mediastinal contour is within normal limits.  No acute osseous abnormalities are seen.  IMPRESSION: No acute cardiopulmonary process seen.   Original Report Authenticated By: Tonia Ghent, M.D.     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  We discussed treatment options including when necessary or daily AV nodal blockade versus catheter ablation. We discussed potential risks including but not limited to death perforation and heart block requiring pacemaker implantation. He was inclined to pursue catheter ablation.  I will forward this information to Dr. Russ Halo and ask the to schedule a procedure at his earliest convenience

## 2012-06-27 NOTE — CV Procedure (Addendum)
Cardiac Cath Procedure Note:  Indication: NSTEMI  Procedures performed:  1) Selective coronary angiography 2) Left heart catheterization 3) Left ventriculogram  Description of procedure:   The risks and indication of the procedure were explained. Consent was signed and placed on the chart. An appropriate timeout was taken prior to the procedure. After a normal Allen's test was confirmed, the right wrist was prepped and draped in the routine sterile fashion and anesthetized with 1% local lidocaine.   A 5 FR arterial sheath was then placed in the right radial artery using a modified Seldinger technique. Systemic heparin was administered. 3mg  IV verapamil was given through the sheath. Standard catheters including a JL 3.5, JR4 and straight pigtail were used. All catheter exchanges were made over a wire.  Complications:  None apparent  Findings:  Ao Pressure:117/85 (101) LV Pressure: 119/4/8 There was no signficant gradient across the aortic valve on pullback.  Left main: Distal 20%  LAD: Very large. Wraps apex. Proximal and mid 20% tubular lesions with small intramyocardial bridge in the midsection with no flow limitation. Very large branching 1st diagonal  LCX: Co-dominant. Normal  RCA: Co-dominant. Moderate-sized. Very mild luminal plaquing.   LV-gram done in the RAO projection: Ejection fraction = 55% no wall motion abnormalities  Assessment: 1. Very mild luminal CAD 2. Normal LV function  Plan/Discussion:  NSTEMI due to demand ischemia in setting of SVT. Minimal CAD. Given family history of premature CAD will start low-dose statin. EP to see to schedule ablation. Echo prior to d/c.    Daniel Bensimhon 8:25 AM

## 2012-06-27 NOTE — Progress Notes (Signed)
Pt was resting in bed and had 6 beats of Vtach. Pt asymptomatic. VS stable. Md on call made aware. Will cont to monitor a pt.

## 2012-06-28 NOTE — Discharge Summary (Signed)
Agree with d/c summary. He has mild CAD. Proceed with RF management. Will need f/u with Dr. Ladona Ridgel for RFA of SVT as per Dr. Graciela Husbands.   Truman Hayward 9:46 PM

## 2012-06-30 ENCOUNTER — Telehealth: Payer: Self-pay | Admitting: Internal Medicine

## 2012-06-30 NOTE — Telephone Encounter (Signed)
Patient called to check on the ablation that needs to schedule with Dr. Ladona Ridgel. Dr Graciela Husbands saw pt in the hospital on 06/27/12 and states pt has a history of recurrent SVT and  writes on D/C note " I will forward information to Dr. Russ Halo and ask to schedule a procedure at his earliest convenience" Pt aware that Dr. Lubertha Basque nurse will in the office tomorrow 07/01/12. Pt would like to be called ASAP.

## 2012-06-30 NOTE — Telephone Encounter (Signed)
Pt was seen in ER and was to have an ablation with [facility], wants to get it set up asap, pls call [phone]

## 2012-07-01 NOTE — Telephone Encounter (Signed)
Spoke with patient and have scheduled him for 07/17/12.  He will have his labs the 07/10/12 at 8:00am

## 2012-07-02 ENCOUNTER — Encounter: Payer: Self-pay | Admitting: *Deleted

## 2012-07-03 ENCOUNTER — Encounter: Payer: Self-pay | Admitting: Physician Assistant

## 2012-07-03 ENCOUNTER — Encounter: Payer: Self-pay | Admitting: *Deleted

## 2012-07-03 ENCOUNTER — Other Ambulatory Visit: Payer: Self-pay | Admitting: *Deleted

## 2012-07-03 ENCOUNTER — Ambulatory Visit (INDEPENDENT_AMBULATORY_CARE_PROVIDER_SITE_OTHER): Payer: Federal, State, Local not specified - PPO | Admitting: Physician Assistant

## 2012-07-03 VITALS — BP 120/82 | HR 77 | Ht 71.0 in | Wt 172.8 lb

## 2012-07-03 DIAGNOSIS — I471 Supraventricular tachycardia: Secondary | ICD-10-CM

## 2012-07-03 DIAGNOSIS — E785 Hyperlipidemia, unspecified: Secondary | ICD-10-CM

## 2012-07-03 DIAGNOSIS — R079 Chest pain, unspecified: Secondary | ICD-10-CM

## 2012-07-03 DIAGNOSIS — I498 Other specified cardiac arrhythmias: Secondary | ICD-10-CM

## 2012-07-03 DIAGNOSIS — I214 Non-ST elevation (NSTEMI) myocardial infarction: Secondary | ICD-10-CM

## 2012-07-03 DIAGNOSIS — I2510 Atherosclerotic heart disease of native coronary artery without angina pectoris: Secondary | ICD-10-CM

## 2012-07-03 MED ORDER — METOPROLOL TARTRATE 25 MG PO TABS: 12.5000 mg | ORAL_TABLET | Freq: Two times a day (BID) | ORAL | Status: DC

## 2012-07-03 NOTE — Patient Instructions (Addendum)
CHANGE METOPROLOL TO 25 MG TABLET; TAKE (1/2 TABLET) = 12.5 MG  TWICE DAILY; YOU MAY TAKE AN EXTRA 25 MG TABLET FOR BREAKTHROUGH PALPITATIONS  WORK NOTE HAS BEEN GIVEN TO RETURN TO WORK AS OF 07/28/12

## 2012-07-03 NOTE — Progress Notes (Signed)
[address] [place], [place]  [address] Phone: [phone], Fax:  [phone]  Date:  07/03/2012   Name:  Julian White   DOB:  08/31/1960   MRN:  [id number]  PCP:  No primary provider on file.  Primary Cardiologist:  Dr. Daniel Bensimhon  Primary Electrophysiologist:  None    History of Present Illness: Julian White is a 51 y.o. male who returns for followup after a recent admission to the hospital for a non-STEMI in the setting of SVT.  Patient was admitted 11/28-11/29. He presented with rapid palpitations and chest pain. ECG demonstrated SVT with a HR of 190 that broke with adenosine. Cardiac markers were abnormal ruling him in for a non-STEMI. Echo 06/27/12: Mild LVH, EF 65-70%.  LHC 06/27/12:  dLM 20%, proximal and mid LAD 20%, EF 55% with normal wall motion. Patient was also seen by Dr. Klein for electrophysiology. 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 Arrangements have been made for RFCA with Dr. Taylor December 19. Non-STEMI was likely related to demand ischemia from his rapid rate with SVT. Since discharge, he has had a couple of episodes of chest tightness that have been fairly brief. He denies exertional chest symptoms. He denies dyspnea. He denies orthopnea, PND or edema. He denies any further palpitations. He denies syncope.  Labs (11/13):   K 3.8, creatinine 1.2, ALT 12, peak troponin 1.95, LDL 128, Hgb 13.2, TSH 0.974  Wt Readings from Last 3 Encounters:  07/03/12 172 lb 12.8 oz (78.382 kg)  06/27/12 171 lb 9.6 oz (77.837 kg)  06/27/12 171 lb 9.6 oz (77.837 kg)     Past Medical History  Diagnosis Date  . SVT (supraventricular tachycardia)   . Heart murmur   . Hypertension     per pt dx 20 years but has not had any meds in 20 yrs per md  . NSTEMI (non-ST elevated myocardial infarction)     05/2012:  Type 2 in setting of SVT (HR 190s)  . CAD (coronary artery disease)     minimal plaque by LHC 11/13:LHC 06/27/12:  dLM 20%, proximal and mid LAD  20%, EF 55% with normal wall motion    . Hx of echocardiogram     Echo 06/27/12: Mild LVH, EF 65-70%.    Current Outpatient Prescriptions  Medication Sig Dispense Refill  . aspirin 81 MG chewable tablet Chew 1 tablet (81 mg total) by mouth daily.  30 tablet  0  . metoprolol (LOPRESSOR) 50 MG tablet Take 1 tablet (50 mg total) by mouth as needed (palpitations).  30 tablet  3  . simvastatin (ZOCOR) 20 MG tablet Take 1 tablet (20 mg total) by mouth daily at 6 PM.  30 tablet  3    Allergies:  No Known Allergies  Social History:  The patient  reports that he has never smoked. He does not have any smokeless tobacco history on file. He reports that he does not drink alcohol.   ROS:  Please see the history of present illness.      All other systems reviewed and negative.   PHYSICAL EXAM: VS:  BP 120/82  Pulse 77  Ht 5' 11" (1.803 m)  Wt 172 lb 12.8 oz (78.382 kg)  BMI 24.10 kg/m2 Well nourished, well developed, in no acute distress HEENT: normal Neck: no JVD Cardiac:  normal S1, S2; RRR; no murmur Lungs:  clear to auscultation bilaterally, no wheezing, rhonchi or rales Abd: soft, nontender, no hepatomegaly Ext: no   edema; right wrist without hematoma or bruit  Skin: warm and dry Neuro:  CNs 2-12 intact, no focal abnormalities noted  EKG:  NSR, HR 77, no acute changes      ASSESSMENT AND PLAN:  1. Coronary Artery Disease:   He had a non-STEMI in the setting of SVT. This was likely demand ischemia. He had minimal plaque on cardiac catheterization. Continue aspirin and statin.  He will be placed on Metoprolol 12.5 mg bid.   2. Supraventricular Tachycardia:   RFCA planned with Dr. Taylor 07/17/12.  He can take Metoprolol 25 mg x 1 prn palpitations.  3. Hyperlipidemia:  Continue simvastatin.  Check Lipids and LFTs in 6-8 weeks.    4. Disposition:  I will provide him with a note to remain out of work until after his upcoming procedure.  Signed, Joyceann Kruser, PA-C  2:09 PM 07/03/2012     

## 2012-07-10 ENCOUNTER — Other Ambulatory Visit (INDEPENDENT_AMBULATORY_CARE_PROVIDER_SITE_OTHER): Payer: Federal, State, Local not specified - PPO

## 2012-07-10 ENCOUNTER — Encounter (HOSPITAL_COMMUNITY): Payer: Self-pay | Admitting: Respiratory Therapy

## 2012-07-10 DIAGNOSIS — I471 Supraventricular tachycardia: Secondary | ICD-10-CM

## 2012-07-10 DIAGNOSIS — I498 Other specified cardiac arrhythmias: Secondary | ICD-10-CM

## 2012-07-10 LAB — CBC WITH DIFFERENTIAL/PLATELET
Basophils Absolute: 0 10*3/uL (ref 0.0–0.1)
HCT: 44.7 % (ref 39.0–52.0)
Hemoglobin: 15.3 g/dL (ref 13.0–17.0)
Lymphs Abs: 1.8 10*3/uL (ref 0.7–4.0)
MCHC: 34.4 g/dL (ref 30.0–36.0)
MCV: 89.6 fl (ref 78.0–100.0)
Monocytes Absolute: 0.5 10*3/uL (ref 0.1–1.0)
Neutro Abs: 4.7 10*3/uL (ref 1.4–7.7)
Platelets: 229 10*3/uL (ref 150.0–400.0)
RDW: 11.9 % (ref 11.5–14.6)

## 2012-07-10 LAB — BASIC METABOLIC PANEL
BUN: 19 mg/dL (ref 6–23)
CO2: 27 mEq/L (ref 19–32)
Chloride: 103 mEq/L (ref 96–112)
Glucose, Bld: 99 mg/dL (ref 70–99)
Potassium: 3.7 mEq/L (ref 3.5–5.1)

## 2012-07-17 ENCOUNTER — Ambulatory Visit (HOSPITAL_COMMUNITY)
Admission: RE | Admit: 2012-07-17 | Discharge: 2012-07-18 | Disposition: A | Discharge status: Home / Self Care | Payer: Federal, State, Local not specified - PPO | Source: Ambulatory Visit | Attending: Internal Medicine | Admitting: Internal Medicine

## 2012-07-17 ENCOUNTER — Encounter (HOSPITAL_COMMUNITY): Payer: Self-pay | Admitting: General Practice

## 2012-07-17 ENCOUNTER — Encounter (HOSPITAL_COMMUNITY)
Admission: RE | Disposition: A | Discharge status: Home / Self Care | Payer: Self-pay | Source: Ambulatory Visit | Attending: Internal Medicine

## 2012-07-17 DIAGNOSIS — E785 Hyperlipidemia, unspecified: Secondary | ICD-10-CM | POA: Insufficient documentation

## 2012-07-17 DIAGNOSIS — I471 Supraventricular tachycardia: Secondary | ICD-10-CM

## 2012-07-17 DIAGNOSIS — I214 Non-ST elevation (NSTEMI) myocardial infarction: Secondary | ICD-10-CM

## 2012-07-17 DIAGNOSIS — Z7982 Long term (current) use of aspirin: Secondary | ICD-10-CM | POA: Insufficient documentation

## 2012-07-17 DIAGNOSIS — R011 Cardiac murmur, unspecified: Secondary | ICD-10-CM

## 2012-07-17 DIAGNOSIS — R079 Chest pain, unspecified: Secondary | ICD-10-CM

## 2012-07-17 DIAGNOSIS — I2510 Atherosclerotic heart disease of native coronary artery without angina pectoris: Secondary | ICD-10-CM | POA: Insufficient documentation

## 2012-07-17 DIAGNOSIS — Z79899 Other long term (current) drug therapy: Secondary | ICD-10-CM | POA: Insufficient documentation

## 2012-07-17 DIAGNOSIS — I498 Other specified cardiac arrhythmias: Secondary | ICD-10-CM | POA: Insufficient documentation

## 2012-07-17 DIAGNOSIS — I10 Essential (primary) hypertension: Secondary | ICD-10-CM | POA: Insufficient documentation

## 2012-07-17 DIAGNOSIS — Z7902 Long term (current) use of antithrombotics/antiplatelets: Secondary | ICD-10-CM | POA: Insufficient documentation

## 2012-07-17 HISTORY — PX: SUPRAVENTRICULAR TACHYCARDIA ABLATION: SHX5492

## 2012-07-17 HISTORY — DX: Headache: R51

## 2012-07-17 HISTORY — DX: Angina pectoris, unspecified: I20.9

## 2012-07-17 LAB — PROTIME-INR
INR: 0.91 (ref 0.00–1.49)
Prothrombin Time: 12.2 seconds (ref 11.6–15.2)

## 2012-07-17 SURGERY — SUPRAVENTRICULAR TACHYCARDIA ABLATION
Anesthesia: LOCAL

## 2012-07-17 MED ORDER — METOPROLOL TARTRATE 12.5 MG HALF TABLET
12.5000 mg | ORAL_TABLET | Freq: Two times a day (BID) | ORAL | Status: DC
  Administered 2012-07-17 – 2012-07-18 (×3): 12.5 mg via ORAL
  Filled 2012-07-17 (×5): qty 1

## 2012-07-17 MED ORDER — FENTANYL CITRATE 0.05 MG/ML IJ SOLN
INTRAMUSCULAR | Status: AC
  Filled 2012-07-17: qty 2

## 2012-07-17 MED ORDER — BUPIVACAINE HCL (PF) 0.25 % IJ SOLN
INTRAMUSCULAR | Status: AC
  Filled 2012-07-17: qty 60

## 2012-07-17 MED ORDER — SODIUM CHLORIDE 0.9 % IJ SOLN
3.0000 mL | INTRAMUSCULAR | Status: DC | PRN
  Administered 2012-07-17 – 2012-07-18 (×2): 3 mL via INTRAVENOUS

## 2012-07-17 MED ORDER — ASPIRIN 81 MG PO CHEW
81.0000 mg | CHEWABLE_TABLET | Freq: Every day | ORAL | Status: DC
  Administered 2012-07-17 – 2012-07-18 (×2): 81 mg via ORAL
  Filled 2012-07-17 (×2): qty 1

## 2012-07-17 MED ORDER — MIDAZOLAM HCL 5 MG/5ML IJ SOLN
INTRAMUSCULAR | Status: AC
  Filled 2012-07-17: qty 5

## 2012-07-17 MED ORDER — HEPARIN (PORCINE) IN NACL 2-0.9 UNIT/ML-% IJ SOLN
INTRAMUSCULAR | Status: AC
  Filled 2012-07-17: qty 500

## 2012-07-17 MED ORDER — ACETAMINOPHEN 325 MG PO TABS: 650.0000 mg | ORAL_TABLET | ORAL | Status: DC | PRN

## 2012-07-17 MED ORDER — ONDANSETRON HCL 4 MG/2ML IJ SOLN: 4.0000 mg | Freq: Four times a day (QID) | INTRAMUSCULAR | Status: DC | PRN

## 2012-07-17 MED ORDER — SODIUM CHLORIDE 0.9 % IV SOLN: 250.0000 mL | INTRAVENOUS | Status: DC | PRN

## 2012-07-17 MED ORDER — SODIUM CHLORIDE 0.9 % IJ SOLN
3.0000 mL | Freq: Two times a day (BID) | INTRAMUSCULAR | Status: DC
  Administered 2012-07-17: 3 mL via INTRAVENOUS

## 2012-07-17 NOTE — Op Note (Signed)
NAME:  Julian White, Julian White NO.:  [id number]  MEDICAL RECORD NO.:  [id number]  LOCATION:  [id number]                        FACILITY:  [facility]  PHYSICIAN:  Doylene Canning. 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  INTRODUCTION:  The patient is a very pleasant 51 year old male with recurrent tachy palpitations, documented SVT at rates up to 180 beats per minute.  Most recently he was admitted after a non-ST elevation MI associated with his SVT and was found to have a supply demand mismatch. He is now referred for catheter ablation.  PROCEDURE:  After informed consent was obtained, the patient was taken to the diagnostic EP lab in a fasting state.  After usual preparation and draping, intravenous fentanyl and midazolam was given for sedation. A 6-French hexapolar catheter was inserted percutaneously in the right jugular vein and advanced to the coronary sinus.  A 6-French quadripolar catheter was inserted percutaneously in the right femoral vein and advanced to His bundle region.  A 6-French quadripolar catheter was inserted percutaneously in the right femoral vein and advanced to the right ventricle.  After measurement of basic intervals, rapid ventricular pacing was carried out from the right ventricle paced at a base drive cycle length of 409 millisecond, stepwise decreased down to 280 millisecond where VA conduction persisted.  During rapid ventricular pacing, the atrial activation was midline and decremental.  Next, programmed ventricular stimulation was carried out in the right ventricle at a base drive cycle length of 811 milliseconds.  This was 2 interval stepwise decreased from 540 milliseconds down to 240 milliseconds where ventricular refractoriness was observed.   During programmed ventricular stimulation, the atrial activation was midline and decremental.  Next, programmed atrial stimulation was carried out from the atrium at a base drive cycle length of 914 milliseconds.  The S1, S2 interval stepwise decreased down to 350 milliseconds where SVT was induced.  This was AV node reentrant tachycardia.  The cycle length was 380 milliseconds.  The atrial activation sequence was midline.  PVCs placed at time of His bundle refractoriness did not pre-excite the atrium.  Ventricular pacing demonstrated a VAV activation sequence.  The atrial activation was midline.  With all of the above, a diagnosis of AV node reentrant tachycardia was made.  Rapid atrial pacing was then carried out at 250 milliseconds which terminated the tachycardia.  At this point, rapid atrial pacing was carried out down to 330 milliseconds without induction of SVT.  Next, the ablation catheter, a 7-French quadripolar catheter was inserted percutaneously through the right femoral vein and advanced up into Koch's triangle.  Mapping of Koch's triangle was carried out, demonstrated normal size and orientation. Three RF energy applications were then delivered, resulted in accelerated junctional rhythm.  Following this, additional rapid atrial pacing and programmed atrial stimulation and rapid ventricular pacing and programmed ventricular stimulation was carried out from the right ventricle over the course of approximately 20 minutes.  During this, there was no inducible SVT.  The  catheters were removed, hemostasis was assured, and the patient was returned to his room in satisfactory condition.  COMPLICATIONS:  There were no immediate procedure complications.  RESULTS:  A.  Baseline ECG.  Baseline ECG demonstrates sinus rhythm with normal axis and intervals. B.  Baseline intervals.  Sinus node cycle length 727 milliseconds.  The HV interval was 41 milliseconds.  QRS duration was 100  milliseconds. C.  Rapid ventricular pacing.  Rapid ventricular pacing was carried out from the right ventricle demonstrating VA Wenckebach at less than 280 milliseconds.  During rapid ventricular pacing, the atrial activation was midline and decremental. D.  Programmed ventricular stimulation.  Programmed ventricular stimulation was carried out from the right ventricle at base drive cycle length of 161 milliseconds.  This was 2 interval stepwise decreased down to 240 milliseconds where ventricular refractoriness was observed. During programmed ventricular stimulation, the atrial activation was midline and decremental. E.  Rapid atrial pacing.  Rapid atrial pacing was carried out from the right atrium and the coronary sinus at base drive cycle length of 096 milliseconds and S1, S2 interval was stepwise decreased down to 309 milliseconds where AV Wenckebach was observed.  This was present after ablation. F.  Programmed atrial stimulation.  Programmed atrial stimulation was carried out at base drive cycle length of 045 milliseconds.  The S1, S2 interval stepwise decreased down to 350 milliseconds resulting in the initiation of SVT.  Following ablation, S1, S2 interval stepwise decreased down to 310 milliseconds where the AV node ERP was observed. During programmed atrial stimulation, before ablation, there were multiple AH jumps and echo beats and inducible SVT, after ablation there were residual AH jump and echo beat, but no SVT. G. Arrhythmias observed. 1. AV node reentrant tachycardia.  Initiation was with programmed     atrial stimulation.  Duration was sustained.  Termination was with     rapid atrial pacing.     a.     Mapping.  Mapping of the patient's SVT demonstrated AV node      reentrant tachycardia.     b.     RF energy application.  Total of 3 RF energy applications      were delivered to sites 6 to 7 in Koch's triangle resulted in      accelerated junctional rhythm and no  inducible SVT.  CONCLUSION:  The study demonstrates successful electrophysiologic study and RF catheter ablation of AV node reentrant tachycardia with a total of 3 RF energy applications delivered to sites 6 and 7 in Koch's triangle.  Following ablation, there was no inducible SVT.     Doylene Canning. Ladona Ridgel, MD     GWT/MEDQ  D:  07/17/2012  T:  07/17/2012  Job:  [id number]

## 2012-07-17 NOTE — Op Note (Signed)
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 [id number].

## 2012-07-17 NOTE — Interval H&P Note (Signed)
History and Physical Interval Note: Patient seen and examined. Agree with [name] White note. I have discussed the risks/benefits/goals/expectations of EPS/RFA of SVT and he wishes to proceed.  07/17/2012 7:18 AM  [name]  has presented today for surgery, with the diagnosis of SVT  The various methods of treatment have been discussed with the patient and family. After consideration of risks, benefits and other options for treatment, the patient has consented to  Procedure(s) (LRB) with comments: SUPRAVENTRICULAR TACHYCARDIA ABLATION (N/A) as a surgical intervention .  The patient's history has been reviewed, patient examined, no change in status, stable for surgery.  I have reviewed the patient's chart and labs.  Questions were answered to the patient's satisfaction.     Lewayne Bunting

## 2012-07-17 NOTE — H&P (View-Only) (Signed)
[address] [place], [place]  [address] Phone: [phone], Fax:  [phone]  Date:  07/03/2012   Name:  Julian White   DOB:  11-18-1960   MRN:  [id number]  PCP:  No primary provider on file.  Primary Cardiologist:  Dr. Arvilla Meres  Primary Electrophysiologist:  None    History of Present Illness: Julian White is a 51 y.o. male who returns for followup after a recent admission to the hospital for a non-STEMI in the setting of SVT.  Patient was admitted 11/28-11/29. He presented with rapid palpitations and chest pain. ECG demonstrated SVT with a HR of 190 that broke with adenosine. Cardiac markers were abnormal ruling him in for a non-STEMI. Echo 06/27/12: Mild LVH, EF 65-70%.  LHC 06/27/12:  dLM 20%, proximal and mid LAD 20%, EF 55% with normal wall motion. Patient was also seen by Dr. Graciela Husbands for electrophysiology. 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 Arrangements have been made for RFCA with Dr. Ladona Ridgel December 19. Non-STEMI was likely related to demand ischemia from his rapid rate with SVT. Since discharge, he has had a couple of episodes of chest tightness that have been fairly brief. He denies exertional chest symptoms. He denies dyspnea. He denies orthopnea, PND or edema. He denies any further palpitations. He denies syncope.  Labs (11/13):   K 3.8, creatinine 1.2, ALT 12, peak troponin 1.95, LDL 128, Hgb 13.2, TSH 0.974  Wt Readings from Last 3 Encounters:  07/03/12 172 lb 12.8 oz (78.382 kg)  06/27/12 171 lb 9.6 oz (77.837 kg)  06/27/12 171 lb 9.6 oz (77.837 kg)     Past Medical History  Diagnosis Date  . SVT (supraventricular tachycardia)   . Heart murmur   . Hypertension     per pt dx 20 years but has not had any meds in 20 yrs per md  . NSTEMI (non-ST elevated myocardial infarction)     05/2012:  Type 2 in setting of SVT (HR 190s)  . CAD (coronary artery disease)     minimal plaque by LHC 11/13:LHC 06/27/12:  dLM 20%, proximal and mid LAD  20%, EF 55% with normal wall motion    . Hx of echocardiogram     Echo 06/27/12: Mild LVH, EF 65-70%.    Current Outpatient Prescriptions  Medication Sig Dispense Refill  . aspirin 81 MG chewable tablet Chew 1 tablet (81 mg total) by mouth daily.  30 tablet  0  . metoprolol (LOPRESSOR) 50 MG tablet Take 1 tablet (50 mg total) by mouth as needed (palpitations).  30 tablet  3  . simvastatin (ZOCOR) 20 MG tablet Take 1 tablet (20 mg total) by mouth daily at 6 PM.  30 tablet  3    Allergies:  No Known Allergies  Social History:  The patient  reports that he has never smoked. He does not have any smokeless tobacco history on file. He reports that he does not drink alcohol.   ROS:  Please see the history of present illness.      All other systems reviewed and negative.   PHYSICAL EXAM: VS:  BP 120/82  Pulse 77  Ht 5\' 11"  (1.803 m)  Wt 172 lb 12.8 oz (78.382 kg)  BMI 24.10 kg/m2 Well nourished, well developed, in no acute distress HEENT: normal Neck: no JVD Cardiac:  normal S1, S2; RRR; no murmur Lungs:  clear to auscultation bilaterally, no wheezing, rhonchi or rales Abd: soft, nontender, no hepatomegaly Ext: no  edema; right wrist without hematoma or bruit  Skin: warm and dry Neuro:  CNs 2-12 intact, no focal abnormalities noted  EKG:  NSR, HR 77, no acute changes      ASSESSMENT AND PLAN:  1. Coronary Artery Disease:   He had a non-STEMI in the setting of SVT. This was likely demand ischemia. He had minimal plaque on cardiac catheterization. Continue aspirin and statin.  He will be placed on Metoprolol 12.5 mg bid.   2. Supraventricular Tachycardia:   RFCA planned with Dr. Ladona Ridgel 07/17/12.  He can take Metoprolol 25 mg x 1 prn palpitations.  3. Hyperlipidemia:  Continue simvastatin.  Check Lipids and LFTs in 6-8 weeks.    4. Disposition:  I will provide him with a note to remain out of work until after his upcoming procedure.  Signed, Tereso Newcomer, PA-C  2:09 PM 07/03/2012

## 2012-07-17 NOTE — Progress Notes (Signed)
Assessed for utilization review 

## 2012-07-17 NOTE — Progress Notes (Signed)
Patient c/o weakness post-procedure and requesting to stay over night.  Theodore Demark, PA notified.  Will continue to monitor. Nolon Nations

## 2012-07-18 NOTE — Progress Notes (Signed)
DC orders received.  Patient stable with no S/S of distress.  Medication and discharge information reviewed with patient & patient's wife.  Patient DC home. Nolon Nations

## 2012-07-18 NOTE — Discharge Summary (Addendum)
ELECTROPHYSIOLOGY DISCHARGE SUMMARY    Patient ID: Julian White,  MRN: [id number], DOB/AGE: 49-Jul-1962 49 y.o.  Admit date: 07/17/2012 Discharge date: 07/18/2012  Primary Care Physician: None on file Primary Cardiologist: Bensimhon, MD/Evadne Ose, MD  Primary Discharge Diagnosis:  1. AVNRT s/p EPS +RF ablation  Secondary Discharge Diagnoses:  1. Dyslipidemia 2. Mild nonobstructive CAD s/p cath Nov 2013  Procedures This Admission:  1. EP study +RF ablation AVNRT RESULTS: A. Baseline ECG. Baseline ECG demonstrates sinus rhythm with  normal axis and intervals.  B. Baseline intervals. Sinus node cycle length 727 milliseconds. The  HV interval was 41 milliseconds. QRS duration was 100 milliseconds.  C. Rapid ventricular pacing. Rapid ventricular pacing was carried out  from the right ventricle demonstrating VA Wenckebach at less than 280  milliseconds. During rapid ventricular pacing, the atrial activation  was midline and decremental.  D. Programmed ventricular stimulation. Programmed ventricular  stimulation was carried out from the right ventricle at base drive cycle  length of 409 milliseconds. This was 2 interval stepwise decreased down  to 240 milliseconds where ventricular refractoriness was observed.  During programmed ventricular stimulation, the atrial activation was  midline and decremental.  E. Rapid atrial pacing. Rapid atrial pacing was carried out from the  right atrium and the coronary sinus at base drive cycle length of 811  milliseconds and S1, S2 interval was stepwise decreased down to 309  milliseconds where AV Wenckebach was observed. This was present after  ablation.  F. Programmed atrial stimulation. Programmed atrial stimulation was  carried out at base drive cycle length of 914 milliseconds. The S1, S2  interval stepwise decreased down to 350 milliseconds resulting in the  initiation of SVT. Following ablation, S1, S2 interval stepwise  decreased down to  310 milliseconds where the AV node ERP was observed.  During programmed atrial stimulation, before ablation, there were  multiple AH jumps and echo beats and inducible SVT, after ablation there  were residual AH jump and echo beat, but no SVT.  G. Arrhythmias observed.  1. AV node reentrant tachycardia. Initiation was with programmed  atrial stimulation. Duration was sustained. Termination was with  rapid atrial pacing.  a. Mapping. Mapping of the patient's SVT demonstrated AV node  reentrant tachycardia.  b. RF energy application. Total of 3 RF energy applications  were delivered to sites 6 to 7 in Koch's triangle resulted in  accelerated junctional rhythm and no inducible SVT.  CONCLUSION: The study demonstrates successful electrophysiologic study  and RF catheter ablation of AV node reentrant tachycardia with a total  of 3 RF energy applications delivered to sites 6 and 7 in Koch's  triangle. Following ablation, there was no inducible SVT.  History and Hospital Course:  Julian White is pleasant 51 year old male with recurrent tachypalpitations, documented SVT at rates up to 180 beats per minute. Most recently he was admitted after a non-ST elevation MI associated with his SVT and was found to have a supply demand mismatch. He presented yesterday and underwent comprehensive EP study and catheter ablation of AVNRT. Julian White tolerated this procedure well without any immediate complication. He remains hemodynamically stable and afebrile. His groin site is intact without significant bleeding or hematoma. He has been given discharge instructions including wound care and activity restrictions. He will follow-up in clinic in 12 weeks. Hee has been seen, examined and deemed stable for discharge today by Dr. Lewayne Bunting.  Discharge Vitals: Blood pressure 123/87, pulse 74, temperature 97.9 F (36.6 C), temperature source Oral,  resp. rate 18, height 5\' 11"  (1.803 m), weight 177 lb 12.8 oz (80.65 kg),  SpO2 98.00%.   Labs: Lab Results  Component Value Date   WBC 7.1 07/10/2012   HGB 15.3 07/10/2012   HCT 44.7 07/10/2012   MCV 89.6 07/10/2012   PLT 229.0 07/10/2012   No results found for this basename: NA,K,CL,CO2,BUN,CREATININE,CALCIUM,LABALBU,PROT,BILITOT,ALKPHOS,ALT,AST,GLUCOSE in the last 168 hours Lab Results  Component Value Date   TROPONINI 1.51* 06/27/2012    Basename 07/17/12 0555  INR 0.91    Disposition:  The patient is being discharged in stable condition.  Follow-up: Follow-up Information    Follow up with Lewayne Bunting, MD. On 10/21/2012. (At 9:30 AM)    Contact information:   [address] [place] [address] [phone]       Discharge Medications:    Medication List     As of 07/18/2012 10:54 AM    STOP taking these medications         metoprolol tartrate 25 MG tablet   Commonly known as: LOPRESSOR      TAKE these medications         aspirin 81 MG chewable tablet   Chew 1 tablet (81 mg total) by mouth daily.      simvastatin 20 MG tablet   Commonly known as: ZOCOR   Take 1 tablet (20 mg total) by mouth daily at 6 PM.      Duration of Discharge Encounter: Greater than 30 minutes including physician time.  Signed, Rick Duff, PA-C 07/18/2012, 10:54 AM  EP Attending  Patient seen and examined.   Leonia Reeves.D.

## 2012-09-13 ENCOUNTER — Other Ambulatory Visit: Payer: Self-pay

## 2012-10-21 ENCOUNTER — Encounter: Payer: Self-pay | Admitting: Internal Medicine

## 2012-10-21 ENCOUNTER — Ambulatory Visit (INDEPENDENT_AMBULATORY_CARE_PROVIDER_SITE_OTHER): Payer: Federal, State, Local not specified - PPO | Admitting: Internal Medicine

## 2012-10-21 VITALS — BP 137/94 | HR 76 | Ht 71.0 in | Wt 179.8 lb

## 2012-10-21 DIAGNOSIS — I498 Other specified cardiac arrhythmias: Secondary | ICD-10-CM

## 2012-10-21 DIAGNOSIS — I471 Supraventricular tachycardia: Secondary | ICD-10-CM

## 2012-10-21 NOTE — Assessment & Plan Note (Signed)
He has had no recurrent arrhythmias or palpitations. Will follow up on an as-needed basis.

## 2012-10-21 NOTE — Progress Notes (Signed)
HPI Julian White returns today for followup. He is a pleasant 52 yo man with a h/o SVT, s/p catheter ablation, and dyslipidemia. He has done well since his ablation. No chest pain, sob, or palpitations. No syncope. No Known Allergies   Current Outpatient Prescriptions  Medication Sig Dispense Refill  . aspirin 81 MG chewable tablet Chew 1 tablet (81 mg total) by mouth daily.  30 tablet  0  . simvastatin (ZOCOR) 20 MG tablet Take 1 tablet (20 mg total) by mouth daily at 6 PM.  30 tablet  3   No current facility-administered medications for this visit.     Past Medical History  Diagnosis Date  . SVT (supraventricular tachycardia)   . Heart murmur   . Hypertension     per pt dx 20 years but has not had any meds in 20 yrs per md  . NSTEMI (non-ST elevated myocardial infarction)     05/2012:  Type 2 in setting of SVT (HR 190s)  . CAD (coronary artery disease)     minimal plaque by LHC 11/13:LHC 06/27/12:  dLM 20%, proximal and mid LAD 20%, EF 55% with normal wall motion    . Hx of echocardiogram     Echo 06/27/12: Mild LVH, EF 65-70%.  . Anginal pain   . Headache     ROS:   All systems reviewed and negative except as noted in the HPI.   Past Surgical History  Procedure Laterality Date  . Cardiac catheterization    . Ablation      svt     Family History  Problem Relation Age of Onset  . CAD Brother     s/p MI in his 27s  . CAD Father     s/p CABG at 60     History   Social History  . Marital Status: Married    Spouse Name: N/A    Number of Children: N/A  . Years of Education: N/A   Occupational History  . Not on file.   Social History Main Topics  . Smoking status: Never Smoker   . Smokeless tobacco: Never Used  . Alcohol Use: No  . Drug Use: No  . Sexually Active: Yes   Other Topics Concern  . Not on file   Social History Narrative  . No narrative on file     BP 137/94  Pulse 76  Ht 5\' 11"  (1.803 m)  Wt 179 lb 12.8 oz (81.557 kg)  BMI 25.09  kg/m2  Physical Exam:  Well appearing NAD HEENT: Unremarkable Neck:  No JVD, no thyromegally Lungs:  Clear  HEART:  Regular rate rhythm, no murmurs, no rubs, no clicks Abd:  soft, positive bowel sounds, no organomegally, no rebound, no guarding Ext:  2 plus pulses, no edema, no cyanosis, no clubbing Skin:  No rashes no nodules Neuro:  CN II through XII intact, motor grossly intact  EKG   Assess/Plan:

## 2012-10-21 NOTE — Patient Instructions (Signed)
Your physician recommends that you schedule a follow-up appointment as needed  

## 2013-06-04 ENCOUNTER — Other Ambulatory Visit: Payer: Self-pay

## 2014-07-08 ENCOUNTER — Encounter (HOSPITAL_COMMUNITY): Payer: Self-pay | Admitting: Internal Medicine

## 2017-05-21 DIAGNOSIS — I10 Essential (primary) hypertension: Secondary | ICD-10-CM | POA: Diagnosis not present

## 2017-06-18 DIAGNOSIS — Z13220 Encounter for screening for lipoid disorders: Secondary | ICD-10-CM | POA: Diagnosis not present

## 2017-06-18 DIAGNOSIS — I10 Essential (primary) hypertension: Secondary | ICD-10-CM | POA: Diagnosis not present

## 2017-08-09 DIAGNOSIS — Z01818 Encounter for other preprocedural examination: Secondary | ICD-10-CM | POA: Diagnosis not present

## 2017-09-18 DIAGNOSIS — I10 Essential (primary) hypertension: Secondary | ICD-10-CM | POA: Diagnosis not present

## 2017-09-18 DIAGNOSIS — E7800 Pure hypercholesterolemia, unspecified: Secondary | ICD-10-CM | POA: Diagnosis not present

## 2017-09-27 DIAGNOSIS — Z1211 Encounter for screening for malignant neoplasm of colon: Secondary | ICD-10-CM | POA: Diagnosis not present

## 2017-09-27 DIAGNOSIS — K640 First degree hemorrhoids: Secondary | ICD-10-CM | POA: Diagnosis not present

## 2017-09-27 DIAGNOSIS — K5730 Diverticulosis of large intestine without perforation or abscess without bleeding: Secondary | ICD-10-CM | POA: Diagnosis not present

## 2018-03-17 DIAGNOSIS — E7800 Pure hypercholesterolemia, unspecified: Secondary | ICD-10-CM | POA: Diagnosis not present

## 2018-03-17 DIAGNOSIS — Z23 Encounter for immunization: Secondary | ICD-10-CM | POA: Diagnosis not present

## 2018-03-17 DIAGNOSIS — I10 Essential (primary) hypertension: Secondary | ICD-10-CM | POA: Diagnosis not present

## 2018-03-17 DIAGNOSIS — I252 Old myocardial infarction: Secondary | ICD-10-CM | POA: Diagnosis not present

## 2018-10-07 DIAGNOSIS — E7800 Pure hypercholesterolemia, unspecified: Secondary | ICD-10-CM | POA: Diagnosis not present

## 2018-10-07 DIAGNOSIS — I10 Essential (primary) hypertension: Secondary | ICD-10-CM | POA: Diagnosis not present

## 2018-10-07 DIAGNOSIS — I252 Old myocardial infarction: Secondary | ICD-10-CM | POA: Diagnosis not present

## 2018-11-14 DIAGNOSIS — G4733 Obstructive sleep apnea (adult) (pediatric): Secondary | ICD-10-CM | POA: Diagnosis not present

## 2019-04-09 DIAGNOSIS — E7800 Pure hypercholesterolemia, unspecified: Secondary | ICD-10-CM | POA: Diagnosis not present

## 2019-04-09 DIAGNOSIS — I10 Essential (primary) hypertension: Secondary | ICD-10-CM | POA: Diagnosis not present

## 2019-04-09 DIAGNOSIS — I252 Old myocardial infarction: Secondary | ICD-10-CM | POA: Diagnosis not present

## 2019-10-20 DIAGNOSIS — E7800 Pure hypercholesterolemia, unspecified: Secondary | ICD-10-CM | POA: Diagnosis not present

## 2019-10-20 DIAGNOSIS — Z0000 Encounter for general adult medical examination without abnormal findings: Secondary | ICD-10-CM | POA: Diagnosis not present

## 2019-10-20 DIAGNOSIS — Z125 Encounter for screening for malignant neoplasm of prostate: Secondary | ICD-10-CM | POA: Diagnosis not present

## 2019-10-20 DIAGNOSIS — I10 Essential (primary) hypertension: Secondary | ICD-10-CM | POA: Diagnosis not present

## 2019-10-20 DIAGNOSIS — Z131 Encounter for screening for diabetes mellitus: Secondary | ICD-10-CM | POA: Diagnosis not present

## 2020-01-12 ENCOUNTER — Other Ambulatory Visit: Payer: Self-pay | Admitting: Orthopedic Surgery

## 2020-01-12 ENCOUNTER — Ambulatory Visit (HOSPITAL_COMMUNITY)
Admission: EM | Admit: 2020-01-12 | Discharge: 2020-01-12 | Disposition: A | Discharge status: Home / Self Care | Payer: Federal, State, Local not specified - PPO

## 2020-01-12 ENCOUNTER — Other Ambulatory Visit: Payer: Self-pay

## 2020-01-12 ENCOUNTER — Ambulatory Visit
Admission: RE | Admit: 2020-01-12 | Discharge: 2020-01-12 | Disposition: A | Discharge status: Home / Self Care | Payer: Federal, State, Local not specified - PPO | Source: Ambulatory Visit | Attending: Orthopedic Surgery | Admitting: Orthopedic Surgery

## 2020-01-12 ENCOUNTER — Encounter (HOSPITAL_COMMUNITY): Payer: Self-pay

## 2020-01-12 ENCOUNTER — Ambulatory Visit (INDEPENDENT_AMBULATORY_CARE_PROVIDER_SITE_OTHER): Payer: Federal, State, Local not specified - PPO

## 2020-01-12 DIAGNOSIS — Y9373 Activity, racquet and hand sports: Secondary | ICD-10-CM | POA: Diagnosis not present

## 2020-01-12 DIAGNOSIS — Z20822 Contact with and (suspected) exposure to covid-19: Secondary | ICD-10-CM | POA: Diagnosis not present

## 2020-01-12 DIAGNOSIS — W1839XA Other fall on same level, initial encounter: Secondary | ICD-10-CM | POA: Diagnosis not present

## 2020-01-12 DIAGNOSIS — M25522 Pain in left elbow: Secondary | ICD-10-CM

## 2020-01-12 DIAGNOSIS — S52122A Displaced fracture of head of left radius, initial encounter for closed fracture: Secondary | ICD-10-CM | POA: Diagnosis not present

## 2020-01-12 DIAGNOSIS — T148XXA Other injury of unspecified body region, initial encounter: Secondary | ICD-10-CM

## 2020-01-12 DIAGNOSIS — W19XXXA Unspecified fall, initial encounter: Secondary | ICD-10-CM | POA: Diagnosis not present

## 2020-01-12 DIAGNOSIS — M8588 Other specified disorders of bone density and structure, other site: Secondary | ICD-10-CM | POA: Diagnosis not present

## 2020-01-12 DIAGNOSIS — S52092A Other fracture of upper end of left ulna, initial encounter for closed fracture: Secondary | ICD-10-CM | POA: Diagnosis not present

## 2020-01-12 DIAGNOSIS — R000 Tachycardia, unspecified: Secondary | ICD-10-CM | POA: Diagnosis not present

## 2020-01-12 DIAGNOSIS — N179 Acute kidney failure, unspecified: Secondary | ICD-10-CM | POA: Diagnosis not present

## 2020-01-12 DIAGNOSIS — M1812 Unilateral primary osteoarthritis of first carpometacarpal joint, left hand: Secondary | ICD-10-CM | POA: Diagnosis not present

## 2020-01-12 DIAGNOSIS — R52 Pain, unspecified: Secondary | ICD-10-CM | POA: Diagnosis not present

## 2020-01-12 DIAGNOSIS — Y999 Unspecified external cause status: Secondary | ICD-10-CM | POA: Diagnosis not present

## 2020-01-12 DIAGNOSIS — S52022A Displaced fracture of olecranon process without intraarticular extension of left ulna, initial encounter for closed fracture: Secondary | ICD-10-CM | POA: Diagnosis not present

## 2020-01-12 MED ORDER — ONDANSETRON 4 MG PO TBDP
ORAL_TABLET | ORAL | Status: AC
  Filled 2020-01-12: qty 1

## 2020-01-12 MED ORDER — ACETAMINOPHEN 325 MG PO TABS
ORAL_TABLET | ORAL | Status: AC
  Filled 2020-01-12: qty 2

## 2020-01-12 MED ORDER — MORPHINE SULFATE (PF) 4 MG/ML IV SOLN
INTRAVENOUS | Status: AC
  Filled 2020-01-12: qty 1

## 2020-01-12 MED ORDER — ONDANSETRON 4 MG PO TBDP
4.0000 mg | ORAL_TABLET | Freq: Once | ORAL | Status: AC
  Administered 2020-01-12: 4 mg via ORAL

## 2020-01-12 MED ORDER — HYDROCODONE-ACETAMINOPHEN 5-325 MG PO TABS
1.0000 | ORAL_TABLET | Freq: Once | ORAL | Status: AC
  Administered 2020-01-12: 1 via ORAL

## 2020-01-12 MED ORDER — HYDROCODONE-ACETAMINOPHEN 5-325 MG PO TABS
ORAL_TABLET | ORAL | Status: AC
Start: 2020-01-12 — End: ?
  Filled 2020-01-12: qty 1

## 2020-01-12 MED ORDER — ACETAMINOPHEN 325 MG PO TABS
650.0000 mg | ORAL_TABLET | Freq: Once | ORAL | Status: AC
  Administered 2020-01-12: 650 mg via ORAL

## 2020-01-12 MED ORDER — MORPHINE SULFATE (PF) 2 MG/ML IV SOLN
2.0000 mg | Freq: Once | INTRAVENOUS | Status: AC
  Administered 2020-01-12: 2 mg via INTRAMUSCULAR

## 2020-01-12 NOTE — Discharge Instructions (Addendum)
Report to the [facility] on [address] in the [employer], address above

## 2020-01-12 NOTE — ED Triage Notes (Signed)
Pt states was playing pickle ball and fell on left elbow today. Pt 9/10 sharp pain in left elbow. Pt has 2+ swelling of left elbow. Pt unable to straighten left arm. Pt denies numbness and tingling. Pt has 2+ left radial pulse, 4/5 left grip strength, sensation intact, cap refill less than 3 sec.

## 2020-01-12 NOTE — ED Provider Notes (Signed)
[place]    CSN: [id number] Arrival date & time: 01/12/20  0912      History   Chief Complaint Chief Complaint  Patient presents with  . Elbow Injury    HPI Julian White is a 59 y.o. male.   Patient presents for left elbow injury.  He reports about 1 hour ago he was playing pickle ball and he fell directly onto the left elbow.  He reports immediate pain at the point of his left elbow.  Reports since then he has not been able to bend it fully or extended.  He has to cradle it in a neutral position.  Does not have any numbness or tingling in the arm.  He reports the pain does shoot down the forearm to his hand.  No previous injuries.     Past Medical History:  Diagnosis Date  . Anginal pain ([place])   . CAD (coronary artery disease)    minimal plaque by LHC 11/13:LHC 06/27/12:  dLM 20%, proximal and mid LAD 20%, EF 55% with normal wall motion    . Headache(784.0)   . Heart murmur   . Hx of echocardiogram    Echo 06/27/12: Mild LVH, EF 65-70%.  . Hypertension    per pt dx 20 years but has not had any meds in 20 yrs per md  . NSTEMI (non-ST elevated myocardial infarction) ([place])    05/2012:  Type 2 in setting of SVT (HR 190s)  . SVT (supraventricular tachycardia) [facility])     Patient Active Problem List   Diagnosis Date Noted  . SVT (supraventricular tachycardia) ([place]) 06/27/2012  . Heart murmur   . NSTEMI (non-ST elevated myocardial infarction) ([place]) 06/26/2012    Past Surgical History:  Procedure Laterality Date  . ABLATION     svt  . CARDIAC CATHETERIZATION    . LEFT HEART CATHETERIZATION WITH CORONARY ANGIOGRAM N/A 06/27/2012   Procedure: LEFT HEART CATHETERIZATION WITH CORONARY ANGIOGRAM;  Surgeon: Jolaine Artist, MD;  Location: [facility] CATH LAB;  Service: Cardiovascular;  Laterality: N/A;  . SUPRAVENTRICULAR TACHYCARDIA ABLATION N/A 07/17/2012   Procedure: SUPRAVENTRICULAR TACHYCARDIA ABLATION;  Surgeon: Evans Lance, MD;  Location: [facility] CATH LAB;   Service: Cardiovascular;  Laterality: N/A;       Home Medications    Prior to Admission medications   Medication Sig Start Date End Date Taking? Authorizing Provider  amLODipine-benazepril (LOTREL) 5-10 MG capsule Take 1 capsule by mouth daily. 11/15/19   [provider]  aspirin 81 MG chewable tablet Chew 1 tablet (81 mg total) by mouth daily. 06/26/12   Powers, Lonia Farber, MD  simvastatin (ZOCOR) 20 MG tablet Take 1 tablet (20 mg total) by mouth daily at 6 PM. 06/27/12   Arguello, Lyda Perone, PA-C    Family History Family History  Problem Relation Age of Onset  . CAD Brother        s/p MI in his 82s  . CAD Father        s/p CABG at 20    Social History Social History   Tobacco Use  . Smoking status: Never Smoker  . Smokeless tobacco: Never Used  Substance Use Topics  . Alcohol use: No  . Drug use: No     Allergies   Patient has no known allergies.   Review of Systems Review of Systems   Physical Exam Triage Vital Signs ED Triage Vitals  Enc Vitals Group     BP 01/12/20 0946 121/85  Pulse Rate 01/12/20 0946 (!) 101     Resp 01/12/20 0946 16     Temp 01/12/20 0946 98 F (36.7 C)     Temp Source 01/12/20 0946 Oral     SpO2 01/12/20 0946 99 %     Weight 01/12/20 0947 185 lb (83.9 kg)     Height 01/12/20 0947 5\' 11"  (1.803 m)     Head Circumference --      Peak Flow --      Pain Score 01/12/20 0947 9     Pain Loc --      Pain Edu? --      Excl. in GC? --    No data found.  Updated Vital Signs BP 121/85   Pulse (!) 101   Temp 98 F (36.7 C) (Oral)   Resp 16   Ht 5\' 11"  (1.803 m)   Wt 185 lb (83.9 kg)   SpO2 99%   BMI 25.80 kg/m   Visual Acuity Right Eye Distance:   Left Eye Distance:   Bilateral Distance:    Right Eye Near:   Left Eye Near:    Bilateral Near:     Physical Exam Vitals and nursing note reviewed.  Constitutional:      General: He is not in acute distress.    Appearance: He is well-developed. He is not  ill-appearing.     Comments: Patient standing and cradling the left elbow  HENT:     Head: Normocephalic and atraumatic.  Cardiovascular:     Rate and Rhythm: Tachycardia present.  Pulmonary:     Effort: Pulmonary effort is normal. No respiratory distress.  Musculoskeletal:     Cervical back: Neck supple.     Comments: Left arm : there is swelling and ecchymosis on the posterior aspect of the left elbow.  Patient has reduced range of motion due to pain.  There is significant tenderness over the olecranon and bony structures of the left elbow.  Sensation intact and cap refill less than 2 seconds.  Radial pulse 2+  Skin:    General: Skin is warm and dry.     Findings: Bruising present.  Neurological:     Mental Status: He is alert.      UC Treatments / Results  Labs (all labs ordered are listed, but only abnormal results are displayed) Labs Reviewed - No data to display  EKG   Radiology  CLINICAL DATA:  Pain following fall  EXAM: LEFT ELBOW -1 VIEW  COMPARISON:  None.  FINDINGS: Lateral view obtained. There is a comminuted fracture of the proximal ulna with displaced fracture fragments. Visualized humerus appears intact. No obvious radius fracture evident, although a portion of the radial head is not well visualized on this single view. No appreciable joint space narrowing.  IMPRESSION: Comminuted fracture proximal ulna with displaced fracture fragments. Radius less than optimally visualized proximally on single lateral view. Advise additional images as patient is able or potential CT for further assessment. No evident dislocation.  These results will be called to the ordering clinician or representative by the Radiologist Assistant, and communication documented in the PACS or 01/14/20.   Electronically Signed   By: III M.D.   On: 01/12/2020 10:33 Procedures Procedures (including critical care time)  Medications Ordered in  UC Medications  HYDROcodone-acetaminophen (NORCO/VICODIN) 5-325 MG per tablet 1 tablet (1 tablet Oral Given 01/12/20 1017)  acetaminophen (TYLENOL) tablet 650 mg (650 mg Oral Given 01/12/20 1017)  morphine 2  MG/ML injection 2 mg (2 mg Intramuscular Given 01/12/20 1049)  ondansetron (ZOFRAN-ODT) disintegrating tablet 4 mg (4 mg Oral Given 01/12/20 1049)    Initial Impression / Assessment and Plan / UC Course  I have reviewed the triage vital signs and the nursing notes.  Pertinent labs & imaging results that were available during my care of the patient were reviewed by me and considered in my medical decision making (see chart for details).     #Closed comminuted fracture proximal left ulna Patient 59 year old with a minimally comminuted fracture of the left ulna.  Initial x-rays are very limited due to pain and inability to move the elbow.  Radiologist recommends CT.  Discussed case with supervising physician and given inability to have close contact with on-call orthopedic providers felt best management will be with [facility] urgent care for onsite orthopedic evaluation and evaluation of necessary imaging.  Patient was given hydrocodone, Tylenol and morphine in clinic.  Discharged in a sling with instructions to report to [facility] urgent care.  Called and alerted [facility] staff of his impending arrival.  Patient was agreeable to this plan.  Final Clinical Impressions(s) / UC Diagnoses   Final diagnoses:  Closed comminuted fracture of proximal end of left ulna, initial encounter     Discharge Instructions     Report to the [facility] on [address] in the [employer], address above      ED Prescriptions    None     PDMP not reviewed this encounter.   Hermelinda Medicus, PA-C 01/12/20 2041

## 2020-01-13 ENCOUNTER — Other Ambulatory Visit: Payer: Self-pay

## 2020-01-13 ENCOUNTER — Inpatient Hospital Stay (HOSPITAL_COMMUNITY)
Admit: 2020-01-13 | Discharge: 2020-01-15 | DRG: 512 | Disposition: A | Discharge status: Home / Self Care | Payer: Federal, State, Local not specified - PPO | Attending: Orthopedic Surgery | Admitting: Orthopedic Surgery

## 2020-01-13 ENCOUNTER — Encounter (HOSPITAL_COMMUNITY)
Disposition: A | Discharge status: Home / Self Care | Payer: Self-pay | Source: Home / Self Care | Attending: Orthopedic Surgery

## 2020-01-13 ENCOUNTER — Encounter (HOSPITAL_COMMUNITY): Payer: Self-pay | Admitting: Orthopedic Surgery

## 2020-01-13 ENCOUNTER — Ambulatory Visit (HOSPITAL_COMMUNITY): Payer: Federal, State, Local not specified - PPO

## 2020-01-13 ENCOUNTER — Ambulatory Visit (HOSPITAL_COMMUNITY): Payer: Federal, State, Local not specified - PPO | Admitting: Anesthesiology

## 2020-01-13 DIAGNOSIS — Z79899 Other long term (current) drug therapy: Secondary | ICD-10-CM | POA: Diagnosis not present

## 2020-01-13 DIAGNOSIS — S52022A Displaced fracture of olecranon process without intraarticular extension of left ulna, initial encounter for closed fracture: Secondary | ICD-10-CM | POA: Diagnosis present

## 2020-01-13 DIAGNOSIS — S52272A Monteggia's fracture of left ulna, initial encounter for closed fracture: Secondary | ICD-10-CM | POA: Diagnosis not present

## 2020-01-13 DIAGNOSIS — S52122A Displaced fracture of head of left radius, initial encounter for closed fracture: Principal | ICD-10-CM | POA: Diagnosis present

## 2020-01-13 DIAGNOSIS — Z8249 Family history of ischemic heart disease and other diseases of the circulatory system: Secondary | ICD-10-CM | POA: Diagnosis not present

## 2020-01-13 DIAGNOSIS — M25522 Pain in left elbow: Secondary | ICD-10-CM | POA: Diagnosis not present

## 2020-01-13 DIAGNOSIS — I252 Old myocardial infarction: Secondary | ICD-10-CM

## 2020-01-13 DIAGNOSIS — Y9373 Activity, racquet and hand sports: Secondary | ICD-10-CM

## 2020-01-13 DIAGNOSIS — S52002A Unspecified fracture of upper end of left ulna, initial encounter for closed fracture: Secondary | ICD-10-CM | POA: Diagnosis present

## 2020-01-13 DIAGNOSIS — I471 Supraventricular tachycardia: Secondary | ICD-10-CM | POA: Diagnosis not present

## 2020-01-13 DIAGNOSIS — Y999 Unspecified external cause status: Secondary | ICD-10-CM | POA: Diagnosis not present

## 2020-01-13 DIAGNOSIS — S52102A Unspecified fracture of upper end of left radius, initial encounter for closed fracture: Secondary | ICD-10-CM | POA: Diagnosis not present

## 2020-01-13 DIAGNOSIS — I2510 Atherosclerotic heart disease of native coronary artery without angina pectoris: Secondary | ICD-10-CM | POA: Diagnosis not present

## 2020-01-13 DIAGNOSIS — S52092A Other fracture of upper end of left ulna, initial encounter for closed fracture: Secondary | ICD-10-CM | POA: Diagnosis not present

## 2020-01-13 DIAGNOSIS — N179 Acute kidney failure, unspecified: Secondary | ICD-10-CM | POA: Diagnosis not present

## 2020-01-13 DIAGNOSIS — G8918 Other acute postprocedural pain: Secondary | ICD-10-CM | POA: Diagnosis not present

## 2020-01-13 DIAGNOSIS — W19XXXA Unspecified fall, initial encounter: Secondary | ICD-10-CM | POA: Diagnosis not present

## 2020-01-13 DIAGNOSIS — I214 Non-ST elevation (NSTEMI) myocardial infarction: Secondary | ICD-10-CM | POA: Diagnosis not present

## 2020-01-13 DIAGNOSIS — Z20822 Contact with and (suspected) exposure to covid-19: Secondary | ICD-10-CM | POA: Diagnosis present

## 2020-01-13 DIAGNOSIS — M8588 Other specified disorders of bone density and structure, other site: Secondary | ICD-10-CM | POA: Diagnosis not present

## 2020-01-13 DIAGNOSIS — W1839XA Other fall on same level, initial encounter: Secondary | ICD-10-CM | POA: Diagnosis not present

## 2020-01-13 HISTORY — PX: ORIF ELBOW FRACTURE: SHX5031

## 2020-01-13 LAB — CBC
HCT: 42.7 % (ref 39.0–52.0)
Hemoglobin: 14.1 g/dL (ref 13.0–17.0)
MCH: 30.1 pg (ref 26.0–34.0)
MCHC: 33 g/dL (ref 30.0–36.0)
MCV: 91 fL (ref 80.0–100.0)
Platelets: 285 10*3/uL (ref 150–400)
RBC: 4.69 MIL/uL (ref 4.22–5.81)
RDW: 12.1 % (ref 11.5–15.5)
WBC: 13.2 10*3/uL — ABNORMAL HIGH (ref 4.0–10.5)
nRBC: 0 % (ref 0.0–0.2)

## 2020-01-13 LAB — BASIC METABOLIC PANEL
Anion gap: 12 (ref 5–15)
BUN: 26 mg/dL — ABNORMAL HIGH (ref 6–20)
CO2: 21 mmol/L — ABNORMAL LOW (ref 22–32)
Calcium: 9.2 mg/dL (ref 8.9–10.3)
Chloride: 109 mmol/L (ref 98–111)
Creatinine, Ser: 1.58 mg/dL — ABNORMAL HIGH (ref 0.61–1.24)
GFR calc Af Amer: 55 mL/min — ABNORMAL LOW (ref >60)
GFR calc non Af Amer: 48 mL/min — ABNORMAL LOW (ref >60)
Glucose, Bld: 105 mg/dL — ABNORMAL HIGH (ref 70–99)
Potassium: 3.8 mmol/L (ref 3.5–5.1)
Sodium: 142 mmol/L (ref 135–145)

## 2020-01-13 LAB — SARS CORONAVIRUS 2 BY RT PCR (HOSPITAL ORDER, PERFORMED IN ~~LOC~~ HOSPITAL LAB): SARS Coronavirus 2: NEGATIVE

## 2020-01-13 SURGERY — OPEN REDUCTION INTERNAL FIXATION (ORIF) ELBOW/OLECRANON FRACTURE
Anesthesia: General | Site: Elbow | Laterality: Left

## 2020-01-13 MED ORDER — FENTANYL CITRATE (PF) 250 MCG/5ML IJ SOLN
INTRAMUSCULAR | Status: AC
  Filled 2020-01-13: qty 5

## 2020-01-13 MED ORDER — ZOLPIDEM TARTRATE 5 MG PO TABS: 5.0000 mg | ORAL_TABLET | Freq: Every evening | ORAL | Status: DC | PRN

## 2020-01-13 MED ORDER — 0.9 % SODIUM CHLORIDE (POUR BTL) OPTIME
TOPICAL | Status: DC | PRN
  Administered 2020-01-13: 1000 mL

## 2020-01-13 MED ORDER — DIPHENHYDRAMINE HCL 12.5 MG/5ML PO ELIX: 12.5000 mg | ORAL_SOLUTION | ORAL | Status: DC | PRN

## 2020-01-13 MED ORDER — KCL IN DEXTROSE-NACL 10-5-0.45 MEQ/L-%-% IV SOLN
INTRAVENOUS | Status: DC
  Filled 2020-01-13 (×3): qty 1000

## 2020-01-13 MED ORDER — ONDANSETRON HCL 4 MG/2ML IJ SOLN
INTRAMUSCULAR | Status: AC
  Filled 2020-01-13: qty 2

## 2020-01-13 MED ORDER — LACTATED RINGERS IV SOLN: INTRAVENOUS | Status: DC | PRN

## 2020-01-13 MED ORDER — ONDANSETRON HCL 4 MG PO TABS: 4.0000 mg | ORAL_TABLET | Freq: Four times a day (QID) | ORAL | Status: DC | PRN

## 2020-01-13 MED ORDER — MAGNESIUM CITRATE PO SOLN: 1.0000 | Freq: Once | ORAL | Status: DC | PRN

## 2020-01-13 MED ORDER — ONDANSETRON HCL 4 MG/2ML IJ SOLN
INTRAMUSCULAR | Status: DC | PRN
  Administered 2020-01-13: 4 mg via INTRAVENOUS

## 2020-01-13 MED ORDER — POLYETHYLENE GLYCOL 3350 17 G PO PACK: 17.0000 g | PACK | Freq: Every day | ORAL | Status: DC | PRN

## 2020-01-13 MED ORDER — ONDANSETRON HCL 4 MG/2ML IJ SOLN: 4.0000 mg | Freq: Four times a day (QID) | INTRAMUSCULAR | Status: DC | PRN

## 2020-01-13 MED ORDER — FENTANYL CITRATE (PF) 100 MCG/2ML IJ SOLN
INTRAMUSCULAR | Status: AC
  Administered 2020-01-13: 100 ug via INTRAVENOUS
  Filled 2020-01-13: qty 2

## 2020-01-13 MED ORDER — ROPIVACAINE HCL 5 MG/ML IJ SOLN
INTRAMUSCULAR | Status: DC | PRN
Start: 2020-01-13 — End: 2020-01-13
  Administered 2020-01-13: 30 mL via PERINEURAL

## 2020-01-13 MED ORDER — METHOCARBAMOL 500 MG PO TABS
500.0000 mg | ORAL_TABLET | Freq: Four times a day (QID) | ORAL | Status: DC | PRN
  Administered 2020-01-14 – 2020-01-15 (×3): 500 mg via ORAL
  Filled 2020-01-13 (×3): qty 1

## 2020-01-13 MED ORDER — MIDAZOLAM HCL 2 MG/2ML IJ SOLN: 2.0000 mg | Freq: Once | INTRAMUSCULAR | Status: AC

## 2020-01-13 MED ORDER — CHLORHEXIDINE GLUCONATE 0.12 % MT SOLN
15.0000 mL | Freq: Once | OROMUCOSAL | Status: AC
  Administered 2020-01-13: 15 mL via OROMUCOSAL
  Filled 2020-01-13: qty 15

## 2020-01-13 MED ORDER — ACETAMINOPHEN 325 MG PO TABS: 325.0000 mg | ORAL_TABLET | Freq: Four times a day (QID) | ORAL | Status: DC | PRN

## 2020-01-13 MED ORDER — LACTATED RINGERS IV SOLN: INTRAVENOUS | Status: DC

## 2020-01-13 MED ORDER — OXYCODONE HCL 5 MG PO TABS: 5.0000 mg | ORAL_TABLET | ORAL | Status: DC | PRN

## 2020-01-13 MED ORDER — METHOCARBAMOL 1000 MG/10ML IJ SOLN
500.0000 mg | Freq: Four times a day (QID) | INTRAVENOUS | Status: DC | PRN
  Filled 2020-01-13: qty 5

## 2020-01-13 MED ORDER — PHENYLEPHRINE 40 MCG/ML (10ML) SYRINGE FOR IV PUSH (FOR BLOOD PRESSURE SUPPORT)
PREFILLED_SYRINGE | INTRAVENOUS | Status: DC | PRN
  Administered 2020-01-13 (×2): 40 ug via INTRAVENOUS
  Administered 2020-01-13 (×2): 80 ug via INTRAVENOUS

## 2020-01-13 MED ORDER — MIDAZOLAM HCL 2 MG/2ML IJ SOLN
INTRAMUSCULAR | Status: AC
  Filled 2020-01-13: qty 2

## 2020-01-13 MED ORDER — OXYCODONE HCL ER 10 MG PO T12A
10.0000 mg | EXTENDED_RELEASE_TABLET | Freq: Two times a day (BID) | ORAL | Status: DC
  Administered 2020-01-13 – 2020-01-15 (×4): 10 mg via ORAL
  Filled 2020-01-13 (×5): qty 1

## 2020-01-13 MED ORDER — PROPOFOL 10 MG/ML IV BOLUS
INTRAVENOUS | Status: DC | PRN
  Administered 2020-01-13: 30 mg via INTRAVENOUS

## 2020-01-13 MED ORDER — PROPOFOL 500 MG/50ML IV EMUL
INTRAVENOUS | Status: DC | PRN
Start: 2020-01-13 — End: 2020-01-13
  Administered 2020-01-13: 150 ug/kg/min via INTRAVENOUS

## 2020-01-13 MED ORDER — MIDAZOLAM HCL 2 MG/2ML IJ SOLN
INTRAMUSCULAR | Status: AC
  Administered 2020-01-13: 2 mg via INTRAVENOUS
  Filled 2020-01-13: qty 2

## 2020-01-13 MED ORDER — OXYCODONE HCL 5 MG PO TABS
10.0000 mg | ORAL_TABLET | ORAL | Status: DC | PRN
  Administered 2020-01-14 – 2020-01-15 (×4): 15 mg via ORAL
  Filled 2020-01-13 (×4): qty 3

## 2020-01-13 MED ORDER — FENTANYL CITRATE (PF) 100 MCG/2ML IJ SOLN: 25.0000 ug | INTRAMUSCULAR | Status: DC | PRN

## 2020-01-13 MED ORDER — ONDANSETRON HCL 4 MG/2ML IJ SOLN
4.0000 mg | Freq: Four times a day (QID) | INTRAMUSCULAR | Status: AC | PRN
  Administered 2020-01-13: 4 mg via INTRAVENOUS

## 2020-01-13 MED ORDER — FENTANYL CITRATE (PF) 100 MCG/2ML IJ SOLN: 100.0000 ug | Freq: Once | INTRAMUSCULAR | Status: AC

## 2020-01-13 MED ORDER — HYDROMORPHONE HCL 1 MG/ML IJ SOLN
0.5000 mg | INTRAMUSCULAR | Status: DC | PRN
  Administered 2020-01-14 (×2): 1 mg via INTRAVENOUS
  Filled 2020-01-13 (×2): qty 1

## 2020-01-13 MED ORDER — BISACODYL 5 MG PO TBEC: 5.0000 mg | DELAYED_RELEASE_TABLET | Freq: Every day | ORAL | Status: DC | PRN

## 2020-01-13 MED ORDER — ORAL CARE MOUTH RINSE: 15.0000 mL | Freq: Once | OROMUCOSAL | Status: AC

## 2020-01-13 MED ORDER — CEFAZOLIN SODIUM-DEXTROSE 2-4 GM/100ML-% IV SOLN
2.0000 g | INTRAVENOUS | Status: AC
  Administered 2020-01-13: 2 g via INTRAVENOUS
  Filled 2020-01-13: qty 100

## 2020-01-13 MED ORDER — OXYCODONE HCL 5 MG PO TABS: 5.0000 mg | ORAL_TABLET | Freq: Once | ORAL | Status: DC | PRN

## 2020-01-13 MED ORDER — OXYCODONE HCL 5 MG/5ML PO SOLN: 5.0000 mg | Freq: Once | ORAL | Status: DC | PRN

## 2020-01-13 MED ORDER — DOCUSATE SODIUM 100 MG PO CAPS
100.0000 mg | ORAL_CAPSULE | Freq: Two times a day (BID) | ORAL | Status: DC
  Administered 2020-01-13 – 2020-01-15 (×3): 100 mg via ORAL
  Filled 2020-01-13 (×3): qty 1

## 2020-01-13 MED ORDER — ACETAMINOPHEN 500 MG PO TABS
1000.0000 mg | ORAL_TABLET | Freq: Four times a day (QID) | ORAL | Status: AC
  Administered 2020-01-13 – 2020-01-14 (×3): 1000 mg via ORAL
  Filled 2020-01-13 (×3): qty 2

## 2020-01-13 MED ORDER — CLONIDINE HCL (ANALGESIA) 100 MCG/ML EP SOLN
EPIDURAL | Status: DC | PRN
Start: 2020-01-13 — End: 2020-01-13
  Administered 2020-01-13: 100 ug

## 2020-01-13 SURGICAL SUPPLY — 77 items
BIT DRILL 1.8 CANN MAX VPC (BIT) ×2 IMPLANT
BIT DRILL 2.5X2.75 QC CALB (BIT) ×2 IMPLANT
BIT DRILL CALIBRATED 2.7 (BIT) ×2 IMPLANT
BNDG COHESIVE 4X5 TAN STRL (GAUZE/BANDAGES/DRESSINGS) ×2 IMPLANT
BNDG ELASTIC 3X5.8 VLCR STR LF (GAUZE/BANDAGES/DRESSINGS) IMPLANT
BNDG ELASTIC 4X5.8 VLCR STR LF (GAUZE/BANDAGES/DRESSINGS) ×2 IMPLANT
BNDG ESMARK 4X9 LF (GAUZE/BANDAGES/DRESSINGS) ×2 IMPLANT
BNDG GAUZE ELAST 4 BULKY (GAUZE/BANDAGES/DRESSINGS) ×4 IMPLANT
CORD BIPOLAR FORCEPS 12FT (ELECTRODE) ×2 IMPLANT
COVER MAYO STAND STRL (DRAPES) ×2 IMPLANT
COVER SURGICAL LIGHT HANDLE (MISCELLANEOUS) ×2 IMPLANT
COVER WAND RF STERILE (DRAPES) ×2 IMPLANT
CUFF TOURN SGL QUICK 18X4 (TOURNIQUET CUFF) ×2 IMPLANT
CUFF TOURN SGL QUICK 24 (TOURNIQUET CUFF)
CUFF TRNQT CYL 24X4X16.5-23 (TOURNIQUET CUFF) IMPLANT
DRAPE INCISE IOBAN 66X45 STRL (DRAPES) ×2 IMPLANT
DRAPE OEC MINIVIEW 54X84 (DRAPES) IMPLANT
DRAPE ORTHO SPLIT 77X108 STRL (DRAPES) ×2
DRAPE SURG ORHT 6 SPLT 77X108 (DRAPES) ×2 IMPLANT
DRAPE U-SHAPE 47X51 STRL (DRAPES) ×2 IMPLANT
DRIVER BIT HEX CANN 1.5 (ORTHOPEDIC DISPOSABLE SUPPLIES) ×2 IMPLANT
DRSG ADAPTIC 3X8 NADH LF (GAUZE/BANDAGES/DRESSINGS) IMPLANT
GAUZE SPONGE 4X4 12PLY STRL (GAUZE/BANDAGES/DRESSINGS) ×2 IMPLANT
GAUZE XEROFORM 1X8 LF (GAUZE/BANDAGES/DRESSINGS) ×4 IMPLANT
GAUZE XEROFORM 5X9 LF (GAUZE/BANDAGES/DRESSINGS) ×2 IMPLANT
GLOVE BIOGEL PI IND STRL 8.5 (GLOVE) ×1 IMPLANT
GLOVE BIOGEL PI INDICATOR 8.5 (GLOVE) ×1
GLOVE SURG ORTHO 8.0 STRL STRW (GLOVE) ×2 IMPLANT
GOWN STRL REUS W/ TWL LRG LVL3 (GOWN DISPOSABLE) ×2 IMPLANT
GOWN STRL REUS W/ TWL XL LVL3 (GOWN DISPOSABLE) ×1 IMPLANT
GOWN STRL REUS W/TWL LRG LVL3 (GOWN DISPOSABLE) ×2
GOWN STRL REUS W/TWL XL LVL3 (GOWN DISPOSABLE) ×1
K-WIRE COCR 0.9X95 (WIRE) ×6
K-WIRE FIXATION 2.0X6 (WIRE) ×4
KIT BASIN OR (CUSTOM PROCEDURE TRAY) ×2 IMPLANT
KIT TURNOVER KIT B (KITS) ×2 IMPLANT
KWIRE COCR 0.9X95 (WIRE) ×3 IMPLANT
KWIRE FIXATION 2.0X6 (WIRE) ×2 IMPLANT
LOOP VESSEL MAXI BLUE (MISCELLANEOUS) IMPLANT
MANIFOLD NEPTUNE II (INSTRUMENTS) ×2 IMPLANT
NEEDLE HYPO 25GX1X1/2 BEV (NEEDLE) IMPLANT
NS IRRIG 1000ML POUR BTL (IV SOLUTION) ×2 IMPLANT
PACK ORTHO EXTREMITY (CUSTOM PROCEDURE TRAY) ×2 IMPLANT
PAD ARMBOARD 7.5X6 YLW CONV (MISCELLANEOUS) ×4 IMPLANT
PAD CAST 4YDX4 CTTN HI CHSV (CAST SUPPLIES) ×1 IMPLANT
PADDING CAST COTTON 4X4 STRL (CAST SUPPLIES) ×1
PLATE OLECRANON LRG (Plate) ×2 IMPLANT
SCREW COMP HEADLESS 2.5X18 (Screw) ×4 IMPLANT
SCREW CORT 3.5X26 (Screw) ×1 IMPLANT
SCREW CORT T15 26X3.5XST LCK (Screw) ×1 IMPLANT
SCREW CORT T15 28X3.5XST LCK (Screw) ×1 IMPLANT
SCREW CORTICAL 3.5X28MM (Screw) ×1 IMPLANT
SCREW CORTICAL 3.5X46MM (Screw) ×2 IMPLANT
SCREW LOCK CORT STAR 3.5X10 (Screw) ×2 IMPLANT
SCREW LOCK CORT STAR 3.5X14 (Screw) ×2 IMPLANT
SCREW LOCK CORT STAR 3.5X18 (Screw) ×4 IMPLANT
SCREW LOCK CORT STAR 3.5X22 (Screw) ×4 IMPLANT
SCREW LOW PROFILE 18MMX3.5MM (Screw) ×4 IMPLANT
SCREW LOW PROFILE 22MMX3.5MM (Screw) ×2 IMPLANT
SOAP 2 % CHG 4 OZ (WOUND CARE) ×2 IMPLANT
SUCTION FRAZIER HANDLE 10FR (MISCELLANEOUS)
SUCTION TUBE FRAZIER 10FR DISP (MISCELLANEOUS) IMPLANT
SUT PROLENE 3 0 PS 1 (SUTURE) ×6 IMPLANT
SUT PROLENE 3 0 PS 2 (SUTURE) IMPLANT
SUT PROLENE 4 0 PS 2 18 (SUTURE) IMPLANT
SUT VIC AB 2-0 CT1 27 (SUTURE)
SUT VIC AB 2-0 CT1 36 (SUTURE) ×4 IMPLANT
SUT VIC AB 2-0 CT1 TAPERPNT 27 (SUTURE) IMPLANT
SUT VICRYL 4-0 PS2 18IN ABS (SUTURE) IMPLANT
SUT VICRYL 7 0 TG140 8 (SUTURE) ×2 IMPLANT
SYR CONTROL 10ML LL (SYRINGE) IMPLANT
TOWEL GREEN STERILE (TOWEL DISPOSABLE) ×4 IMPLANT
TOWEL GREEN STERILE FF (TOWEL DISPOSABLE) ×2 IMPLANT
TUBE CONNECTING 12X1/4 (SUCTIONS) IMPLANT
UNDERPAD 30X36 HEAVY ABSORB (UNDERPADS AND DIAPERS) ×2 IMPLANT
WASHER 3.5MM (Orthopedic Implant) ×4 IMPLANT
WATER STERILE IRR 1000ML POUR (IV SOLUTION) ×2 IMPLANT

## 2020-01-13 NOTE — H&P (Signed)
Julian White is an 59 y.o. male.   Chief Complaint: ELBOW PAIN  HPI: The patient is a 59y/o right hand dominant male who fell while playing pickleball on 01/12/20 causing an injury to the left elbow. He was seen in the ED for initial treatment with a splint and medications.  He followed up in our office with continued pain, weakness, numbness, swelling, and stiffness. A stat CT scan of the elbow was ordered and he was called with the results. Discussed the need for surgery of both the proximal radius and ulna.  He is in the long arm splint.  He is here today for surgery.  He denies chest pain, shortness of breath, fever, chills, nausea, vomiting, or diarrhea.    Past Medical History:  Diagnosis Date  . Anginal pain ([facility])   . CAD (coronary artery disease)    minimal plaque by LHC 11/13:LHC 06/27/12:  dLM 20%, proximal and mid LAD 20%, EF 55% with normal wall motion    . Headache(784.0)   . Heart murmur   . Hx of echocardiogram    Echo 06/27/12: Mild LVH, EF 65-70%.  . Hypertension    per pt dx 20 years but has not had any meds in 20 yrs per md  . NSTEMI (non-ST elevated myocardial infarction) ([facility])    05/2012:  Type 2 in setting of SVT (HR 190s)  . SVT (supraventricular tachycardia) ([facility])     Past Surgical History:  Procedure Laterality Date  . ABLATION     svt  . CARDIAC CATHETERIZATION    . LEFT HEART CATHETERIZATION WITH CORONARY ANGIOGRAM N/A 06/27/2012   Procedure: LEFT HEART CATHETERIZATION WITH CORONARY ANGIOGRAM;  Surgeon: Dolores Patty, MD;  Location: [facility] CATH LAB;  Service: Cardiovascular;  Laterality: N/A;  . SUPRAVENTRICULAR TACHYCARDIA ABLATION N/A 07/17/2012   Procedure: SUPRAVENTRICULAR TACHYCARDIA ABLATION;  Surgeon: Marinus Maw, MD;  Location: [facility] CATH LAB;  Service: Cardiovascular;  Laterality: N/A;    Family History  Problem Relation Age of Onset  . CAD Brother        s/p MI in his 36s  . CAD Father        s/p CABG at 73   Social History:   reports that he has never smoked. He has never used smokeless tobacco. He reports that he does not drink alcohol and does not use drugs.  Allergies: No Known Allergies  Medications Prior to Admission  Medication Sig Dispense Refill  . amLODipine-benazepril (LOTREL) 5-10 MG capsule Take 1 capsule by mouth daily.    Marland Kitchen oxyCODONE-acetaminophen (PERCOCET/ROXICET) 5-325 MG tablet Take 1 tablet by mouth 4 (four) times daily as needed (pain).     . rosuvastatin (CRESTOR) 5 MG tablet Take 5 mg by mouth daily.    Marland Kitchen aspirin 81 MG chewable tablet Chew 1 tablet (81 mg total) by mouth daily. (Patient not taking: Reported on 01/13/2020) 30 tablet 0  . simvastatin (ZOCOR) 20 MG tablet Take 1 tablet (20 mg total) by mouth daily at 6 PM. (Patient not taking: Reported on 01/13/2020) 30 tablet 3    No results found for this or any previous visit (from the past 48 hour(s)). DG ELBOW COMPLETE LEFT (3+VIEW)  Result Date: 01/12/2020 CLINICAL DATA:  Pain following fall EXAM: LEFT ELBOW -1 VIEW COMPARISON:  None. FINDINGS: Lateral view obtained. There is a comminuted fracture of the proximal ulna with displaced fracture fragments. Visualized humerus appears intact. No obvious radius fracture evident, although a portion of the radial head  is not well visualized on this single view. No appreciable joint space narrowing. IMPRESSION: Comminuted fracture proximal ulna with displaced fracture fragments. Radius less than optimally visualized proximally on single lateral view. Advise additional images as patient is able or potential CT for further assessment. No evident dislocation. These results will be called to the ordering clinician or representative by the Radiologist Assistant, and communication documented in the PACS or [employer]. Electronically Signed   By: Lowella Grip III M.D.   On: 01/12/2020 10:33   CT ELBOW LEFT WO CONTRAST  Result Date: 01/12/2020 CLINICAL DATA:  Elbow fracture secondary to a fall this  morning. EXAM: CT OF THE UPPER LEFT EXTREMITY WITHOUT CONTRAST TECHNIQUE: Multidetector CT imaging of the upper left extremity was performed according to the standard protocol. COMPARISON:  Radiograph dated 01/12/2020 FINDINGS: Bones/Joint/Cartilage There is an impacted fracture of the volar aspect of the radial head. This fragment is impacted approximately 3.6 mm involves approximately 40% of the articular surface of the radial head. No dislocation. There is an oblique fracture involving the posterior aspect of the olecranon process and proximal ulnar shaft. The articular surface of the proximal ulna with respect of the distal humerus is intact. The fracture of the proximal ulnar shaft does extend into the articulation with the radial head best seen on series 602. Ligaments Suboptimally assessed by CT.  Not well enough seen for assessment. Muscles and Tendons No discrete abnormalities. Soft tissues Edema and hemorrhage in the soft tissues at the posterior aspect of the elbow. IMPRESSION: 1. Impacted fracture of the volar aspect of the radial head as described. 2. Oblique fracture of the posterior aspect of the olecranon process and proximal ulnar shaft. 3. The fracture of the proximal ulnar shaft does extend into the articulation with the radial head. Electronically Signed   By: Lorriane Shire M.D.   On: 01/12/2020 14:57    ROS NO RECENT ILLNESSES OR HOSPITALIZATIONS  There were no vitals taken for this visit. Physical Exam  General Appearance:  Alert, cooperative, no distress, appears stated age  Head:  Normocephalic, without obvious abnormality, atraumatic  Eyes:  Pupils equal, conjunctiva/corneas clear,         Throat: Lips, mucosa, and tongue normal; teeth and gums normal  Neck: No visible masses     Lungs:   respirations unlabored  Chest Wall:  No tenderness or deformity  Heart:  Regular rate and rhythm,  Abdomen:   Soft, non-tender,         Extremities: LUE: splint in place, fingers warm  well perfused able to extend thumb and fingers,   Pulses: 2+ and symmetric  Skin: Skin color, texture, turgor normal, no rashes or lesions     Neurologic: Normal    Assessment LEFT PROXIMAL ULNA AND RADIUS FRACTURE   Plan LEFT ELBOW OPEN REDUCTION AND INTERNAL FIXATION AND REPAIR AS INDICATED OF THE RADIUS AND ULNA   WE ARE PLANNING SURGERY FOR YOUR UPPER EXTREMITY. THE RISKS AND BENEFITS OF SURGERY INCLUDE BUT NOT LIMITED TO BLEEDING INFECTION, DAMAGE TO NEARBY NERVES ARTERIES TENDONS, FAILURE OF SURGERY TO ACCOMPLISH ITS INTENDED GOALS, PERSISTENT SYMPTOMS AND NEED FOR FURTHER SURGICAL INTERVENTION. WITH THIS IN MIND WE WILL PROCEED. I HAVE DISCUSSED WITH THE PATIENT THE PRE AND POSTOPERATIVE REGIMEN AND THE DOS AND DON'TS. PT VOICED UNDERSTANDING AND INFORMED CONSENT SIGNED.   Patient was seen and evaluated in the office yesterday.  Patient's CAT scan was reviewed.  The patient does have the displaced proximal radius and ulna  fracture.  Discussed in detail the reason the rationale for surgical intervention.  We talked about restoring the congruity of the elbow joint.  We talked about open reduction internal fixation and possible radial head arthroplasty for the left elbow.  Risks of surgery include but not limited to bleeding infection damage nearby nerves arteries or tendons loss of motion of the wrist and digits incomplete relief of symptoms nonunion malunion hardware failure stiffness and need for further surgical invention.  Signed informed consent was obtained today.  Patient be kept overnight for IV antibiotics and pain control.  Discharged in the morning more likely than not. Karma Greaser 01/13/2020, 2:48 PM

## 2020-01-13 NOTE — Anesthesia Procedure Notes (Signed)
Procedure Name: MAC Date/Time: 01/13/2020 4:25 PM Performed by: Griffin Dakin, CRNA Pre-anesthesia Checklist: Patient identified, Emergency Drugs available, Suction available and Patient being monitored Oxygen Delivery Method: Simple face mask Induction Type: IV induction Number of attempts: 1 Airway Equipment and Method: Oral airway Placement Confirmation: positive ETCO2 and breath sounds checked- equal and bilateral Dental Injury: Teeth and Oropharynx as per pre-operative assessment

## 2020-01-13 NOTE — Op Note (Signed)
PREOPERATIVE DIAGNOSIS:Left elbow proximal ulna and radius fracture, Monteggia variant  POSTOPERATIVE DIAGNOSIS: Same  ATTENDING SURGEON: Dr. Bradly Bienenstock who scrubbed and present for the entire procedure  ASSISTANT SURGEON: Lambert Mody, [facility] was scrubbed and necessary for aid in reduction internal fixation closure splinting in a timely fashion  ANESTHESIA: Regional with IV sedation  OPERATIVE PROCEDURE: #1: Open treatment of left proximal olecranon fracture, Monteggia variant with internal fixation #2: Open treatment of left proximal radius fracture radial head fracture with internal fixation #3: Radiographs 3 views left elbow  IMPLANTS: Biomet olecranon plate with a combination of locking and nonlocking screws Two Biomet VPC screws for the radial head 18 mm in length 2.5 mm screws   RADIOGRAPHIC INTERPRETATION: AP lateral oblique views of the elbow do show the radial head fixation with the proximal ulna plate in good position with good joint alignment  SURGICAL INDICATIONS: Patient is a right-hand-dominant gentleman who fell yesterday sustaining the closed injury to the left elbow.  Patient was seen evaluate in the office and recommended to undergo the above procedure.  The risks of surgery include but not limited to bleeding infection damage nearby nerves arteries or tendons nonunion malunion hardware failure loss of motion of the wrist and digits incomplete relief of symptoms and need for further surgical intervention.  SURGICAL TECHNIQUE: Patient was palpated find the preoperative holding area marked permanent marker made in the left elbow and indicate correct operative site.  Patient brought back operating placed supine on anesthesia table where the regional anesthetic had been administered.  Preoperative antibiotics were given prior to skin incision.  A well-padded tourniquet placed on the left brachium seal with appropriate drape.  Left upper extremities then prepped and draped in  normal sterile fashion.  A timeout was called the correct site was identified the procedure then begun.  A posterior incision was made curving around the radial portion of the olecranon tip.  Dissection carried down through the skin and subcutaneous tissue after the tourniquet insufflated.  Large skin flaps were then created the fascia layer was incised directly over the bone and the fracture site was then exposed.  The patient did have the incongruity of the radial head with a large volar radial head fragment.  The patient also had anterior position of the radial head slight subluxation, Monteggia variant.  The radial head was then delivered posteriorly with the aid through the fracture site.  In order to maintain reduction the radial head will had been dislocated posteriorly.  This allowed for reduction of the piece and clear visualization of the volar radial fragment.  Once the radial head piece was then placed anatomically 2 K wires were then used appropriate drilling over the K wires were then done and the appropriate depth gauge measurement and screws were then placed with good fixation of the radial head fragment below the subchondral surface of the radial head.  The radial head reduced back nicely within the radiocapitellar joint.  Radiographs were then obtained in all stages of the procedure.  Following this attention was then turned to the proximal olecranon this was then reduced with a reduction clamp the olecranon plate was then applied along the posterior aspect of the olecranon held proximally distally with K wires.  Plate position was then confirmed.  Plate position was then adjusted and a split in the triceps was allowed to move the plate more distally.  Once this was carried out the K wires were placed back and again.  The proximal screws were  then placed locking screws within the olecranon tip region.  These were locking screws 18 mm in length.  Distal fixation was carried out in the shaft with  nonlocking bicortical screw fixation loading the plate and compression.  Following this images were then obtained which confirmed near anatomic reduction.  The screws were then filled appropriately with a combination of locking and nonlocking screws proximally and distally.  After placement of the internal fixation final radiographs of the elbow were then obtained.  The wound was then thoroughly irrigated.  Following thorough wound irrigation the fascia layer was then closed with 2-0 Vicryl suture.  The subcutaneous tissues closed with 3-0 Vicryl.  The skin was then closed with simple 3-0 Prolene.  Xeroform dressing a sterile compressive bandage then applied.  The patient was then placed in a long-arm posterior splint patient was then taken recovery room in good condition.  POSTOPERATIVE PLAN: Patient be admitted overnight for IV antibiotics and pain control.  See him back in the office in 2 weeks for wound check suture removal x-rays down to see our therapist for a long-arm splint begin active range of motion of the elbow wrist and forearm.  Radiographs at each visit.

## 2020-01-13 NOTE — Progress Notes (Signed)
Orthopedic Tech Progress Note Patient Details:  Jamario Colina [facility] 05/24/1961 [id number] Sling was given to nurse  Patient ID: Howell Rucks, male   DOB: [birth year], 59 y.o.   MRN: [id number]   Smitty Pluck 01/13/2020, 7:15 PM

## 2020-01-13 NOTE — Anesthesia Preprocedure Evaluation (Signed)
Anesthesia Evaluation  Patient identified by MRN, date of birth, ID band Patient awake    Reviewed: Allergy & Precautions, H&P , NPO status , Patient's Chart, lab work & pertinent test results  Airway Mallampati: II   Neck ROM: full    Dental   Pulmonary    breath sounds clear to auscultation       Cardiovascular hypertension, + angina + CAD  + dysrhythmias Supra Ventricular Tachycardia  Rhythm:regular Rate:Normal  S/p EP ablation for SVT.   Neuro/Psych    GI/Hepatic   Endo/Other    Renal/GU      Musculoskeletal   Abdominal   Peds  Hematology   Anesthesia Other Findings   Reproductive/Obstetrics                             Anesthesia Physical Anesthesia Plan  ASA: III  Anesthesia Plan: General   Post-op Pain Management:  Regional for Post-op pain   Induction: Intravenous  PONV Risk Score and Plan: 2 and Ondansetron, Dexamethasone, Midazolam and Treatment may vary due to age or medical condition  Airway Management Planned: Oral ETT  Additional Equipment:   Intra-op Plan:   Post-operative Plan: Extubation in OR  Informed Consent: I have reviewed the patients History and Physical, chart, labs and discussed the procedure including the risks, benefits and alternatives for the proposed anesthesia with the patient or authorized representative who has indicated his/her understanding and acceptance.       Plan Discussed with: CRNA, Anesthesiologist and Surgeon  Anesthesia Plan Comments:         Anesthesia Quick Evaluation

## 2020-01-13 NOTE — Anesthesia Procedure Notes (Signed)
Anesthesia Regional Block: Supraclavicular block   Pre-Anesthetic Checklist: ,, timeout performed, Correct Patient, Correct Site, Correct Laterality, Correct Procedure, Correct Position, site marked, Risks and benefits discussed,  Surgical consent,  Pre-op evaluation,  At surgeon's request and post-op pain management  Laterality: Left  Prep: chloraprep       Needles:  Injection technique: Single-shot  Needle Type: Echogenic Needle     Needle Length: 9cm  Needle Gauge: 21     Additional Needles:   Procedures:,,,, ultrasound used (permanent image in chart),,,,  Narrative:  Start time: 01/13/2020 3:21 PM End time: 01/13/2020 3:31 PM Injection made incrementally with aspirations every 5 mL.  Performed by: Personally  Anesthesiologist: Cecile Hearing, MD  Additional Notes: No pain on injection. No increased resistance to injection. Injection made in 5cc increments.  Good needle visualization.  Patient tolerated procedure well.

## 2020-01-13 NOTE — Transfer of Care (Signed)
Immediate Anesthesia Transfer of Care Note  Patient: Trenell Concannon [facility]  Procedure(s) Performed: OPEN REDUCTION INTERNAL FIXATION (ORIF) ELBOW ulna and radius FRACTURE (Left Elbow)  Patient Location: PACU  Anesthesia Type:MAC combined with regional for post-op pain  Level of Consciousness: drowsy  Airway & Oxygen Therapy: Patient Spontanous Breathing  Post-op Assessment: Report given to RN and Post -op Vital signs reviewed and stable  Post vital signs: Reviewed and stable  Last Vitals:  Vitals Value Taken Time  BP 107/72 01/13/20 1824  Temp    Pulse 75 01/13/20 1825  Resp 9 01/13/20 1825  SpO2 100 % 01/13/20 1825  Vitals shown include unvalidated device data.  Last Pain:  Vitals:   01/13/20 1459  TempSrc:   PainSc: 10-Worst pain ever      Patients Stated Pain Goal: 4 (29/93/71 6967)  Complications: No complications documented.

## 2020-01-13 NOTE — Anesthesia Postprocedure Evaluation (Signed)
Anesthesia Post Note  Patient: Trustin Chapa [facility]  Procedure(s) Performed: OPEN REDUCTION INTERNAL FIXATION (ORIF) ELBOW ulna and radius FRACTURE (Left Elbow)     Patient location during evaluation: PACU Anesthesia Type: General Level of consciousness: awake and alert Pain management: pain level controlled Vital Signs Assessment: post-procedure vital signs reviewed and stable Respiratory status: spontaneous breathing, nonlabored ventilation, respiratory function stable and patient connected to nasal cannula oxygen Cardiovascular status: stable and blood pressure returned to baseline Postop Assessment: no apparent nausea or vomiting Anesthetic complications: no   No complications documented.  Last Vitals:  Vitals:   01/13/20 1934 01/13/20 1957  BP:  137/90  Pulse: 76 95  Resp: 11 20  Temp: 36.4 C 36.5 C  SpO2: 100% 98%    Last Pain:  Vitals:   01/13/20 1957  TempSrc: Oral  PainSc:                  Jahzara Slattery COKER

## 2020-01-14 ENCOUNTER — Encounter (HOSPITAL_COMMUNITY): Payer: Self-pay | Admitting: Orthopedic Surgery

## 2020-01-14 DIAGNOSIS — Z20822 Contact with and (suspected) exposure to covid-19: Secondary | ICD-10-CM | POA: Diagnosis present

## 2020-01-14 DIAGNOSIS — I252 Old myocardial infarction: Secondary | ICD-10-CM | POA: Diagnosis not present

## 2020-01-14 DIAGNOSIS — Z8249 Family history of ischemic heart disease and other diseases of the circulatory system: Secondary | ICD-10-CM | POA: Diagnosis not present

## 2020-01-14 DIAGNOSIS — S52022A Displaced fracture of olecranon process without intraarticular extension of left ulna, initial encounter for closed fracture: Secondary | ICD-10-CM | POA: Diagnosis present

## 2020-01-14 DIAGNOSIS — Z79899 Other long term (current) drug therapy: Secondary | ICD-10-CM | POA: Diagnosis not present

## 2020-01-14 DIAGNOSIS — S52122A Displaced fracture of head of left radius, initial encounter for closed fracture: Secondary | ICD-10-CM | POA: Diagnosis present

## 2020-01-14 DIAGNOSIS — Y9373 Activity, racquet and hand sports: Secondary | ICD-10-CM | POA: Diagnosis not present

## 2020-01-14 DIAGNOSIS — S52102A Unspecified fracture of upper end of left radius, initial encounter for closed fracture: Secondary | ICD-10-CM | POA: Diagnosis present

## 2020-01-14 DIAGNOSIS — I2510 Atherosclerotic heart disease of native coronary artery without angina pectoris: Secondary | ICD-10-CM | POA: Diagnosis present

## 2020-01-14 DIAGNOSIS — W19XXXA Unspecified fall, initial encounter: Secondary | ICD-10-CM | POA: Diagnosis present

## 2020-01-14 LAB — HIV ANTIBODY (ROUTINE TESTING W REFLEX): HIV Screen 4th Generation wRfx: NONREACTIVE

## 2020-01-14 NOTE — Plan of Care (Signed)

## 2020-01-14 NOTE — Progress Notes (Signed)
Patient ID: Julian White, male   DOB: April 15, 1961, 59 y.o.   MRN: [id number]   Julian White is still complaining of pain despite being on scheduled pain medication. He is able to wiggle all digits and has kept his splint clean, dry, and intact. Due to the elevated pain, he will remain in the hospital for another night to see if his pain decreases. We will check on him again tomorrow.    Lambert Mody, PA-C 01/14/20

## 2020-01-14 NOTE — Progress Notes (Signed)
MD, patient's surgical block has worn off.  Patient is in a lot of pain.  Patient received at 0500 1mg  Dilaudid iv.  Patient stated pain rate is still a 10.  Patient also received at 0615 Oxy IR 15 mg and 500 mg Robaxin PO along with ice pack.  His pain rate is still a 10. He is requesting a more powerful pain medication when rounding today.

## 2020-01-15 NOTE — Discharge Instructions (Signed)
KEEP [facility]ANDAGE [facility] AND [facility] CALL OFFICE FOR F/U APPT [phone] KEEP HAND ELEVATED ABOVE HEART OK TO APPLY ICE TO OPERATIVE AREA CONTACT OFFICE IF ANY WORSENING PAIN OR CONCERNS.   Follow up in 2 weeks.  All prescriptions have been sent to [facility] [address], [place].

## 2020-01-15 NOTE — Discharge Summary (Signed)
Physician Discharge Summary  Patient ID: Julian White MRN: [id number] DOB/AGE: [birth year] 59 y.o.  Admit date: 01/13/2020 Discharge date:  01/15/20  Admission Diagnoses: Left proximal ulna and radius fracture Past Medical History:  Diagnosis Date  . Anginal pain (HCC)   . CAD (coronary artery disease)    minimal plaque by LHC 11/13:LHC 06/27/12:  dLM 20%, proximal and mid LAD 20%, EF 55% with normal wall motion    . Headache(784.0)   . Heart murmur   . Hx of echocardiogram    Echo 06/27/12: Mild LVH, EF 65-70%.  . Hypertension    per pt dx 20 years but has not had any meds in 20 yrs per md  . NSTEMI (non-ST elevated myocardial infarction) (HCC)    05/2012:  Type 2 in setting of SVT (HR 190s)  . SVT (supraventricular tachycardia) (HCC)     Discharge Diagnoses:  Active Problems:   Closed fracture of left proximal radius and ulna   Surgeries: Procedure(s): OPEN REDUCTION INTERNAL FIXATION (ORIF) ELBOW ulna and radius FRACTURE on 01/13/2020    Consultants:   Discharged Condition: Improved  Hospital Course: Julian White is an 59 y.o. male who was admitted 01/13/2020 with a chief complaint of No chief complaint on file. , and found to have a diagnosis of Left proximal ulna and radius fracture.  They were brought to the operating room on 01/13/2020 and underwent Procedure(s): OPEN REDUCTION INTERNAL FIXATION (ORIF) ELBOW ulna and radius FRACTURE.    They were given perioperative antibiotics:  Anti-infectives (From admission, onward)   Start     Dose/Rate Route Frequency Ordered Stop   01/14/20 0600  ceFAZolin (ANCEF) IVPB 2g/100 mL premix        2 g 200 mL/hr over 30 Minutes Intravenous On call to O.R. 01/13/20 1447 01/13/20 1630    .  They were given sequential compression devices, early ambulation, and Other (comment)ambulation for DVT prophylaxis.  Recent vital signs:  Patient Vitals for the past 24 hrs:  BP Temp Temp src Pulse Resp SpO2  01/15/20 0918 125/87 98.6  F (37 C) Oral 94 16 93 %  01/15/20 0444 (!) 128/97 97.7 F (36.5 C) Oral 86 15 94 %  01/14/20 2004 (!) 142/95 98.3 F (36.8 C) Oral 98 18 95 %  01/14/20 1500 (!) 128/93 97.6 F (36.4 C) Oral (!) 101 17 92 %  .  Recent laboratory studies: DG MINI C-ARM IMAGE ONLY  Result Date: 01/13/2020 There is no interpretation for this exam.  This order is for images obtained during a surgical procedure.  Please See "Surgeries" Tab for more information regarding the procedure.    Discharge Medications:   Allergies as of 01/15/2020   No Known Allergies     Medication List    TAKE these medications   amLODipine-benazepril 5-10 MG capsule Commonly known as: LOTREL Take 1 capsule by mouth daily.   aspirin 81 MG chewable tablet Chew 1 tablet (81 mg total) by mouth daily.   oxyCODONE-acetaminophen 5-325 MG tablet Commonly known as: PERCOCET/ROXICET Take 1 tablet by mouth 4 (four) times daily as needed (pain).   rosuvastatin 5 MG tablet Commonly known as: CRESTOR Take 5 mg by mouth daily.   simvastatin 20 MG tablet Commonly known as: ZOCOR Take 1 tablet (20 mg total) by mouth daily at 6 PM.       Diagnostic Studies: DG ELBOW COMPLETE LEFT (3+VIEW)  Result Date: 01/12/2020 CLINICAL DATA:  Pain following fall EXAM: LEFT ELBOW -1 VIEW  COMPARISON:  None. FINDINGS: Lateral view obtained. There is a comminuted fracture of the proximal ulna with displaced fracture fragments. Visualized humerus appears intact. No obvious radius fracture evident, although a portion of the radial head is not well visualized on this single view. No appreciable joint space narrowing. IMPRESSION: Comminuted fracture proximal ulna with displaced fracture fragments. Radius less than optimally visualized proximally on single lateral view. Advise additional images as patient is able or potential CT for further assessment. No evident dislocation. These results will be called to the ordering clinician or representative by the  Radiologist Assistant, and communication documented in the PACS or [employer]. Electronically Signed   By: Lowella Grip III M.D.   On: 01/12/2020 10:33   CT ELBOW LEFT WO CONTRAST  Result Date: 01/12/2020 CLINICAL DATA:  Elbow fracture secondary to a fall this morning. EXAM: CT OF THE UPPER LEFT EXTREMITY WITHOUT CONTRAST TECHNIQUE: Multidetector CT imaging of the upper left extremity was performed according to the standard protocol. COMPARISON:  Radiograph dated 01/12/2020 FINDINGS: Bones/Joint/Cartilage There is an impacted fracture of the volar aspect of the radial head. This fragment is impacted approximately 3.6 mm involves approximately 40% of the articular surface of the radial head. No dislocation. There is an oblique fracture involving the posterior aspect of the olecranon process and proximal ulnar shaft. The articular surface of the proximal ulna with respect of the distal humerus is intact. The fracture of the proximal ulnar shaft does extend into the articulation with the radial head best seen on series 602. Ligaments Suboptimally assessed by CT.  Not well enough seen for assessment. Muscles and Tendons No discrete abnormalities. Soft tissues Edema and hemorrhage in the soft tissues at the posterior aspect of the elbow. IMPRESSION: 1. Impacted fracture of the volar aspect of the radial head as described. 2. Oblique fracture of the posterior aspect of the olecranon process and proximal ulnar shaft. 3. The fracture of the proximal ulnar shaft does extend into the articulation with the radial head. Electronically Signed   By: Lorriane Shire M.D.   On: 01/12/2020 14:57   DG MINI C-ARM IMAGE ONLY  Result Date: 01/13/2020 There is no interpretation for this exam.  This order is for images obtained during a surgical procedure.  Please See "Surgeries" Tab for more information regarding the procedure.    They benefited maximally from their hospital stay and there were no complications.      Disposition: Discharge disposition: 01-Home or Self Care          Signed: Brynda Peon 01/15/2020, 11:07 AM

## 2020-01-15 NOTE — Progress Notes (Signed)
D/C summary given to this pt.  Pt verbalized understanding to all d/c instructions.  IV d/c pressure dressing applied, site clean and dry.  PT. Shows no sign of distress at this time.  Pt. Transported to main entrance via w/c by NT.

## 2020-01-17 DIAGNOSIS — M7989 Other specified soft tissue disorders: Secondary | ICD-10-CM | POA: Diagnosis not present

## 2020-01-17 DIAGNOSIS — I2699 Other pulmonary embolism without acute cor pulmonale: Secondary | ICD-10-CM | POA: Diagnosis not present

## 2020-01-17 DIAGNOSIS — R071 Chest pain on breathing: Secondary | ICD-10-CM | POA: Diagnosis not present

## 2020-01-17 DIAGNOSIS — S52102D Unspecified fracture of upper end of left radius, subsequent encounter for closed fracture with routine healing: Secondary | ICD-10-CM | POA: Diagnosis not present

## 2020-01-17 DIAGNOSIS — S52002A Unspecified fracture of upper end of left ulna, initial encounter for closed fracture: Secondary | ICD-10-CM | POA: Diagnosis not present

## 2020-01-17 DIAGNOSIS — I10 Essential (primary) hypertension: Secondary | ICD-10-CM | POA: Diagnosis not present

## 2020-01-17 DIAGNOSIS — R112 Nausea with vomiting, unspecified: Secondary | ICD-10-CM | POA: Diagnosis not present

## 2020-01-17 DIAGNOSIS — Z79899 Other long term (current) drug therapy: Secondary | ICD-10-CM | POA: Diagnosis not present

## 2020-01-17 DIAGNOSIS — I82451 Acute embolism and thrombosis of right peroneal vein: Secondary | ICD-10-CM | POA: Diagnosis not present

## 2020-01-17 DIAGNOSIS — I2694 Multiple subsegmental pulmonary emboli without acute cor pulmonale: Secondary | ICD-10-CM | POA: Diagnosis not present

## 2020-01-17 DIAGNOSIS — S52102A Unspecified fracture of upper end of left radius, initial encounter for closed fracture: Secondary | ICD-10-CM | POA: Diagnosis not present

## 2020-01-17 DIAGNOSIS — E7800 Pure hypercholesterolemia, unspecified: Secondary | ICD-10-CM | POA: Diagnosis not present

## 2020-01-17 DIAGNOSIS — I82461 Acute embolism and thrombosis of right calf muscular vein: Secondary | ICD-10-CM | POA: Diagnosis not present

## 2020-01-17 DIAGNOSIS — R0602 Shortness of breath: Secondary | ICD-10-CM | POA: Diagnosis not present

## 2020-01-17 DIAGNOSIS — R079 Chest pain, unspecified: Secondary | ICD-10-CM | POA: Diagnosis not present

## 2020-01-17 DIAGNOSIS — Z86718 Personal history of other venous thrombosis and embolism: Secondary | ICD-10-CM | POA: Diagnosis not present

## 2020-01-17 DIAGNOSIS — R0600 Dyspnea, unspecified: Secondary | ICD-10-CM | POA: Diagnosis not present

## 2020-01-17 DIAGNOSIS — R918 Other nonspecific abnormal finding of lung field: Secondary | ICD-10-CM | POA: Diagnosis not present

## 2020-01-17 DIAGNOSIS — K5900 Constipation, unspecified: Secondary | ICD-10-CM | POA: Diagnosis not present

## 2020-01-17 DIAGNOSIS — X58XXXA Exposure to other specified factors, initial encounter: Secondary | ICD-10-CM | POA: Diagnosis not present

## 2020-01-17 DIAGNOSIS — R000 Tachycardia, unspecified: Secondary | ICD-10-CM | POA: Diagnosis not present

## 2020-01-17 DIAGNOSIS — J90 Pleural effusion, not elsewhere classified: Secondary | ICD-10-CM | POA: Diagnosis not present

## 2020-01-17 DIAGNOSIS — I82452 Acute embolism and thrombosis of left peroneal vein: Secondary | ICD-10-CM | POA: Diagnosis not present

## 2020-01-17 DIAGNOSIS — J9811 Atelectasis: Secondary | ICD-10-CM | POA: Diagnosis not present

## 2020-01-18 DIAGNOSIS — I2699 Other pulmonary embolism without acute cor pulmonale: Secondary | ICD-10-CM | POA: Diagnosis not present

## 2020-01-18 DIAGNOSIS — I82452 Acute embolism and thrombosis of left peroneal vein: Secondary | ICD-10-CM | POA: Diagnosis not present

## 2020-01-18 DIAGNOSIS — Z9889 Other specified postprocedural states: Secondary | ICD-10-CM | POA: Diagnosis not present

## 2020-01-18 DIAGNOSIS — I82451 Acute embolism and thrombosis of right peroneal vein: Secondary | ICD-10-CM | POA: Diagnosis not present

## 2020-01-19 DIAGNOSIS — K6389 Other specified diseases of intestine: Secondary | ICD-10-CM | POA: Diagnosis not present

## 2020-01-19 DIAGNOSIS — R112 Nausea with vomiting, unspecified: Secondary | ICD-10-CM | POA: Diagnosis not present

## 2020-01-19 DIAGNOSIS — I2699 Other pulmonary embolism without acute cor pulmonale: Secondary | ICD-10-CM | POA: Diagnosis not present

## 2020-01-19 DIAGNOSIS — I82451 Acute embolism and thrombosis of right peroneal vein: Secondary | ICD-10-CM | POA: Diagnosis not present

## 2020-01-19 DIAGNOSIS — S52102D Unspecified fracture of upper end of left radius, subsequent encounter for closed fracture with routine healing: Secondary | ICD-10-CM | POA: Diagnosis not present

## 2020-01-19 DIAGNOSIS — I2694 Multiple subsegmental pulmonary emboli without acute cor pulmonale: Secondary | ICD-10-CM | POA: Diagnosis not present

## 2020-01-20 DIAGNOSIS — I2699 Other pulmonary embolism without acute cor pulmonale: Secondary | ICD-10-CM | POA: Diagnosis not present

## 2020-01-20 DIAGNOSIS — I2694 Multiple subsegmental pulmonary emboli without acute cor pulmonale: Secondary | ICD-10-CM | POA: Diagnosis not present

## 2020-01-20 DIAGNOSIS — R109 Unspecified abdominal pain: Secondary | ICD-10-CM | POA: Diagnosis not present

## 2020-01-20 DIAGNOSIS — S52102D Unspecified fracture of upper end of left radius, subsequent encounter for closed fracture with routine healing: Secondary | ICD-10-CM | POA: Diagnosis not present

## 2020-01-20 DIAGNOSIS — I82451 Acute embolism and thrombosis of right peroneal vein: Secondary | ICD-10-CM | POA: Diagnosis not present

## 2020-01-21 DIAGNOSIS — I2694 Multiple subsegmental pulmonary emboli without acute cor pulmonale: Secondary | ICD-10-CM | POA: Diagnosis not present

## 2020-01-21 DIAGNOSIS — I82451 Acute embolism and thrombosis of right peroneal vein: Secondary | ICD-10-CM | POA: Diagnosis not present

## 2020-01-21 DIAGNOSIS — S52102D Unspecified fracture of upper end of left radius, subsequent encounter for closed fracture with routine healing: Secondary | ICD-10-CM | POA: Diagnosis not present

## 2020-01-21 DIAGNOSIS — I2699 Other pulmonary embolism without acute cor pulmonale: Secondary | ICD-10-CM | POA: Diagnosis not present

## 2020-01-25 DIAGNOSIS — S52002D Unspecified fracture of upper end of left ulna, subsequent encounter for closed fracture with routine healing: Secondary | ICD-10-CM | POA: Diagnosis not present

## 2020-01-25 DIAGNOSIS — I2699 Other pulmonary embolism without acute cor pulmonale: Secondary | ICD-10-CM | POA: Diagnosis not present

## 2020-01-25 DIAGNOSIS — R0600 Dyspnea, unspecified: Secondary | ICD-10-CM | POA: Diagnosis not present

## 2020-01-25 DIAGNOSIS — S52102D Unspecified fracture of upper end of left radius, subsequent encounter for closed fracture with routine healing: Secondary | ICD-10-CM | POA: Diagnosis not present

## 2020-01-28 DIAGNOSIS — R0602 Shortness of breath: Secondary | ICD-10-CM | POA: Diagnosis not present

## 2020-01-28 DIAGNOSIS — S52022A Displaced fracture of olecranon process without intraarticular extension of left ulna, initial encounter for closed fracture: Secondary | ICD-10-CM | POA: Diagnosis not present

## 2020-01-28 DIAGNOSIS — R0600 Dyspnea, unspecified: Secondary | ICD-10-CM | POA: Diagnosis not present

## 2020-01-28 DIAGNOSIS — M25522 Pain in left elbow: Secondary | ICD-10-CM | POA: Diagnosis not present

## 2020-02-05 DIAGNOSIS — M25522 Pain in left elbow: Secondary | ICD-10-CM | POA: Diagnosis not present

## 2020-02-09 DIAGNOSIS — M25522 Pain in left elbow: Secondary | ICD-10-CM | POA: Diagnosis not present

## 2020-02-12 DIAGNOSIS — S52002D Unspecified fracture of upper end of left ulna, subsequent encounter for closed fracture with routine healing: Secondary | ICD-10-CM | POA: Diagnosis not present

## 2020-02-12 DIAGNOSIS — M25522 Pain in left elbow: Secondary | ICD-10-CM | POA: Diagnosis not present

## 2020-02-16 DIAGNOSIS — M25629 Stiffness of unspecified elbow, not elsewhere classified: Secondary | ICD-10-CM | POA: Diagnosis not present

## 2020-02-22 DIAGNOSIS — I2699 Other pulmonary embolism without acute cor pulmonale: Secondary | ICD-10-CM | POA: Diagnosis not present

## 2020-02-22 DIAGNOSIS — R0600 Dyspnea, unspecified: Secondary | ICD-10-CM | POA: Diagnosis not present

## 2020-02-23 DIAGNOSIS — M25522 Pain in left elbow: Secondary | ICD-10-CM | POA: Diagnosis not present

## 2020-02-29 DIAGNOSIS — M25522 Pain in left elbow: Secondary | ICD-10-CM | POA: Diagnosis not present

## 2020-03-02 DIAGNOSIS — R0600 Dyspnea, unspecified: Secondary | ICD-10-CM | POA: Diagnosis not present

## 2020-03-04 DIAGNOSIS — M25522 Pain in left elbow: Secondary | ICD-10-CM | POA: Diagnosis not present

## 2020-03-09 DIAGNOSIS — M25522 Pain in left elbow: Secondary | ICD-10-CM | POA: Diagnosis not present

## 2020-03-11 DIAGNOSIS — M25522 Pain in left elbow: Secondary | ICD-10-CM | POA: Diagnosis not present

## 2020-03-11 DIAGNOSIS — S52022A Displaced fracture of olecranon process without intraarticular extension of left ulna, initial encounter for closed fracture: Secondary | ICD-10-CM | POA: Diagnosis not present

## 2020-03-15 DIAGNOSIS — M25522 Pain in left elbow: Secondary | ICD-10-CM | POA: Diagnosis not present

## 2020-03-18 DIAGNOSIS — M25522 Pain in left elbow: Secondary | ICD-10-CM | POA: Diagnosis not present

## 2020-03-21 DIAGNOSIS — M25522 Pain in left elbow: Secondary | ICD-10-CM | POA: Diagnosis not present

## 2020-03-24 DIAGNOSIS — M25522 Pain in left elbow: Secondary | ICD-10-CM | POA: Diagnosis not present

## 2020-03-28 DIAGNOSIS — M25522 Pain in left elbow: Secondary | ICD-10-CM | POA: Diagnosis not present

## 2020-03-30 DIAGNOSIS — M25522 Pain in left elbow: Secondary | ICD-10-CM | POA: Diagnosis not present

## 2020-04-12 DIAGNOSIS — S52272D Monteggia's fracture of left ulna, subsequent encounter for closed fracture with routine healing: Secondary | ICD-10-CM | POA: Diagnosis not present

## 2020-04-12 DIAGNOSIS — M25522 Pain in left elbow: Secondary | ICD-10-CM | POA: Diagnosis not present

## 2020-04-19 DIAGNOSIS — M25522 Pain in left elbow: Secondary | ICD-10-CM | POA: Diagnosis not present

## 2020-04-26 DIAGNOSIS — M25522 Pain in left elbow: Secondary | ICD-10-CM | POA: Diagnosis not present

## 2020-05-03 DIAGNOSIS — M25522 Pain in left elbow: Secondary | ICD-10-CM | POA: Diagnosis not present

## 2020-05-10 DIAGNOSIS — M25522 Pain in left elbow: Secondary | ICD-10-CM | POA: Diagnosis not present

## 2020-05-10 DIAGNOSIS — S52022A Displaced fracture of olecranon process without intraarticular extension of left ulna, initial encounter for closed fracture: Secondary | ICD-10-CM | POA: Diagnosis not present

## 2020-06-27 DIAGNOSIS — R0600 Dyspnea, unspecified: Secondary | ICD-10-CM | POA: Diagnosis not present

## 2020-06-27 DIAGNOSIS — I2699 Other pulmonary embolism without acute cor pulmonale: Secondary | ICD-10-CM | POA: Diagnosis not present

## 2020-06-30 IMAGING — CT CT ELBOW*L* W/O CM
1 series · 12 of 14 positions shown, 15 images · non-contrast
Comparison: Radiograph dated 01/12/2020

CLINICAL DATA: Elbow fracture secondary to a fall this morning.

EXAM:
CT OF THE UPPER LEFT EXTREMITY WITHOUT CONTRAST
TECHNIQUE: Multidetector CT imaging of the upper left extremity was performed
according to the standard protocol.

[Series 6: left elbow soft · axial · 0.37mm/px · z∈[-242,-72]mm · 12 of 101 slices shown, 15 images]
[im 8/101  soft-tissue]
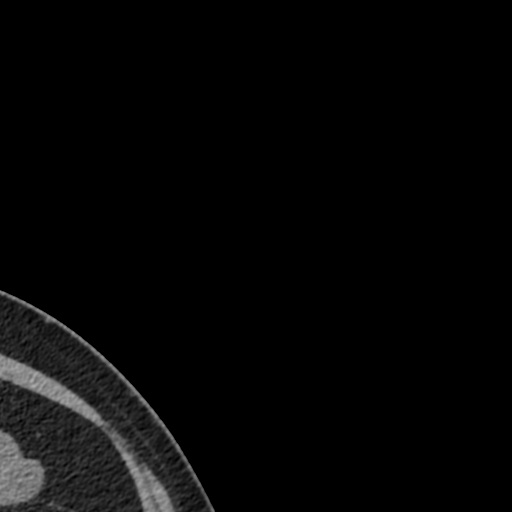
[im 8/101  bone]
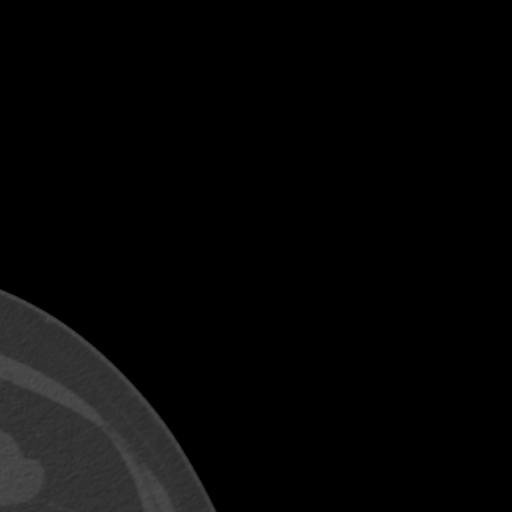
[im 16/101  bone]
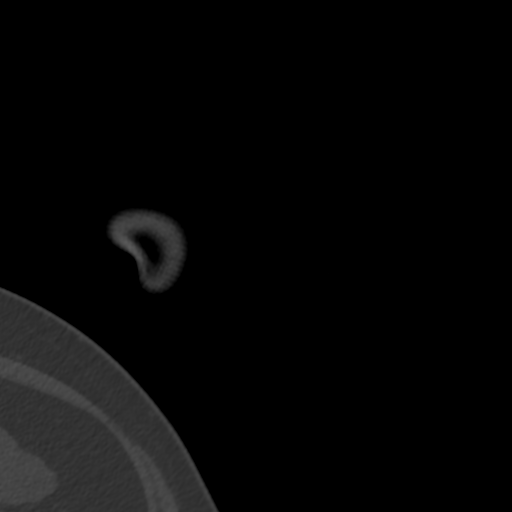
[im 24/101  bone]
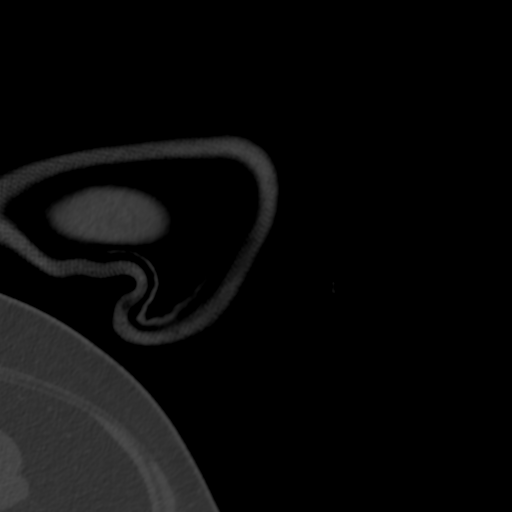
[im 31/101  bone]
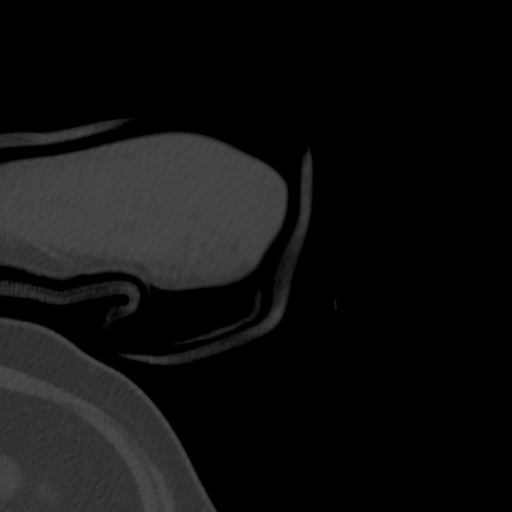
[im 39/101  soft-tissue]
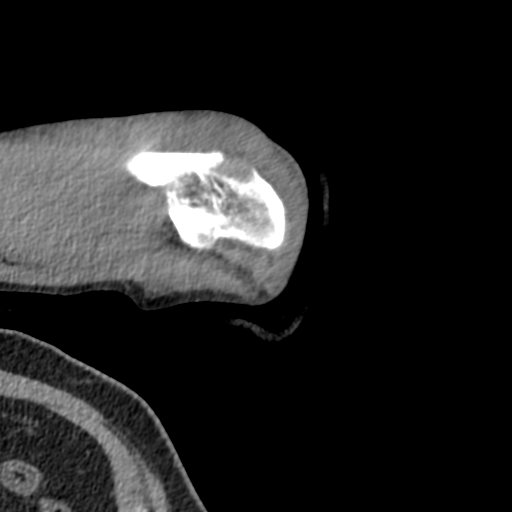
[im 39/101  bone]
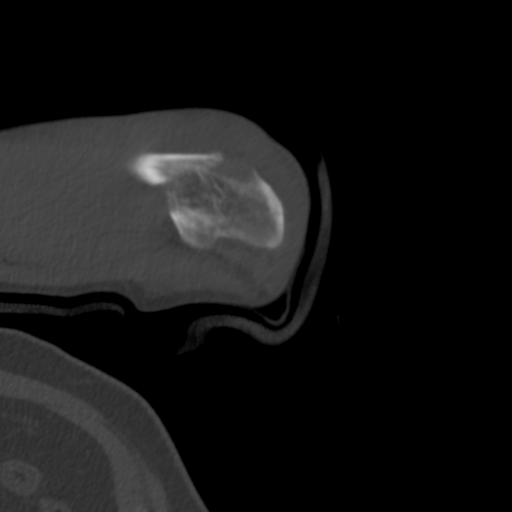
[im 47/101  bone]
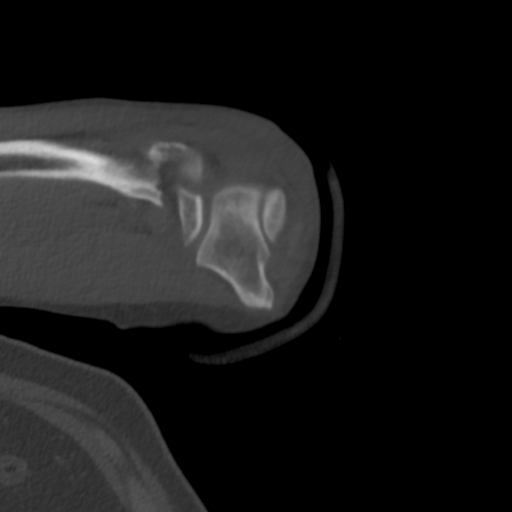
[im 54/101  bone]
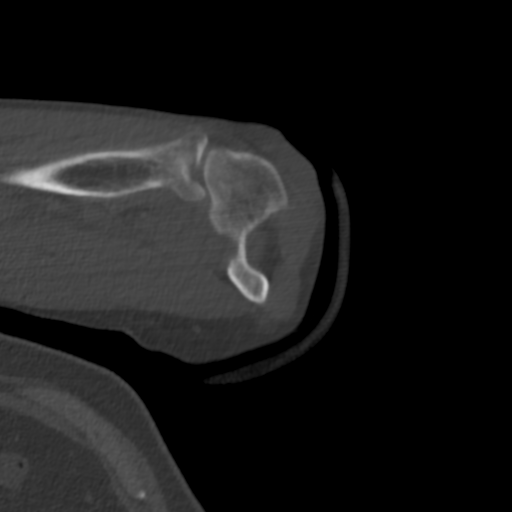
[im 62/101  bone]
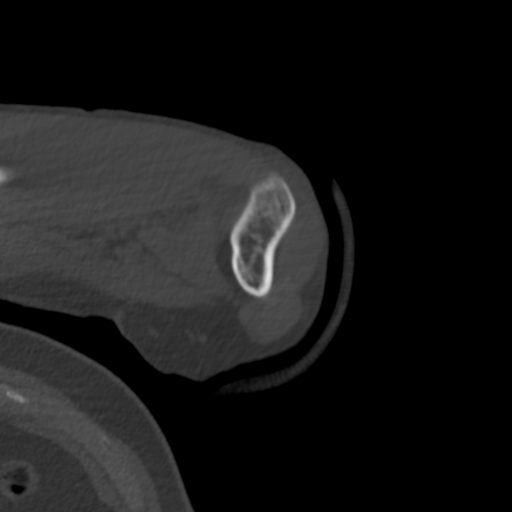
[im 70/101  soft-tissue]
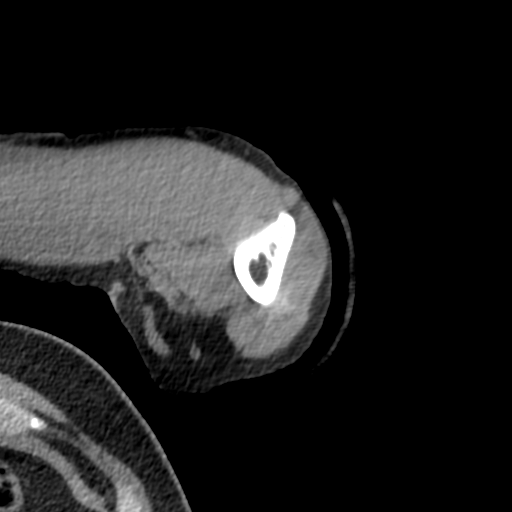
[im 70/101  bone]
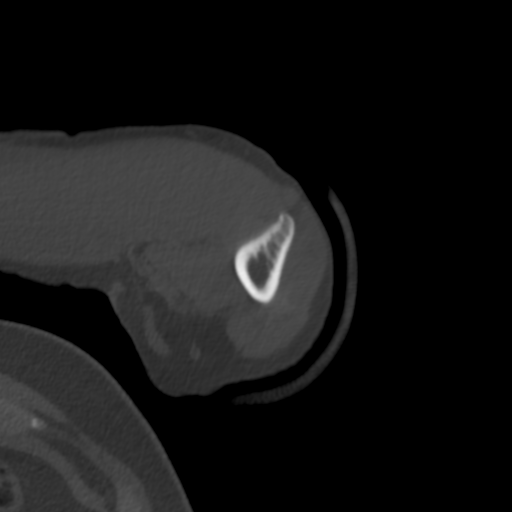
[im 77/101  bone]
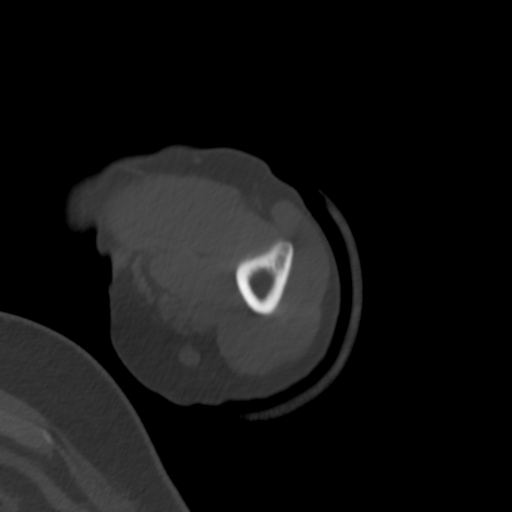
[im 85/101  bone]
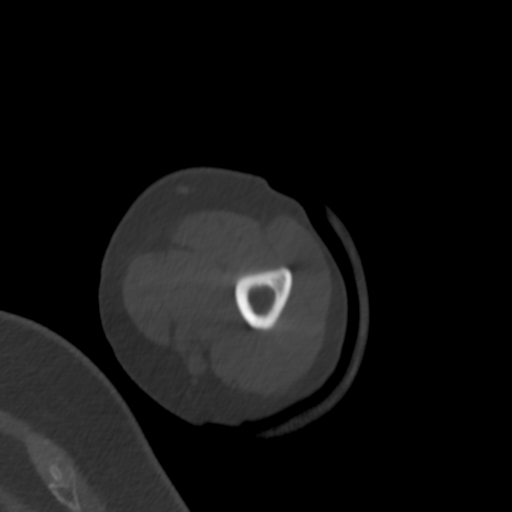
[im 93/101  bone]
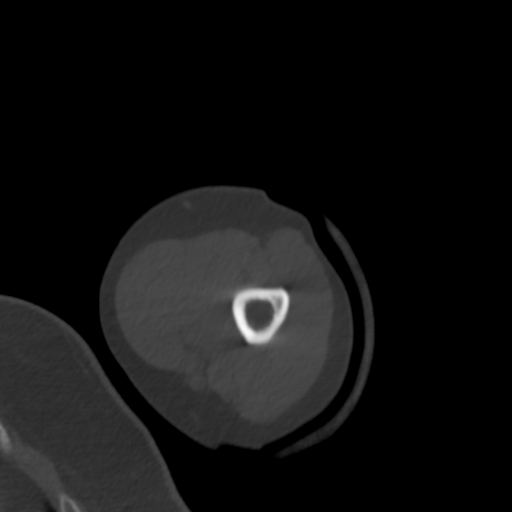

[12 of 14 positions shown; findings below may reference images not displayed]

FINDINGS: Bones/Joint/Cartilage

There is an impacted fracture of the volar aspect of the radial
head. This fragment is impacted approximately 3.6 mm involves
approximately 40% of the articular surface of the radial head. No
dislocation.

There is an oblique fracture involving the posterior aspect of the
olecranon process and proximal ulnar shaft. The articular surface of
the proximal ulna with respect of the distal humerus is intact.

The fracture of the proximal ulnar shaft does extend into the
articulation with the radial head best seen on series 602.

Ligaments

Suboptimally assessed by CT.  Not well enough seen for assessment.

Muscles and Tendons

No discrete abnormalities.

Soft tissues

Edema and hemorrhage in the soft tissues at the posterior aspect of
the elbow.
IMPRESSION: 1. Impacted fracture of the volar aspect of the radial head as
described.
2. Oblique fracture of the posterior aspect of the olecranon process
and proximal ulnar shaft.
3. The fracture of the proximal ulnar shaft does extend into the
articulation with the radial head.

## 2020-07-15 DIAGNOSIS — M25561 Pain in right knee: Secondary | ICD-10-CM | POA: Diagnosis not present

## 2020-10-27 DIAGNOSIS — E7800 Pure hypercholesterolemia, unspecified: Secondary | ICD-10-CM | POA: Diagnosis not present

## 2020-10-27 DIAGNOSIS — Z0000 Encounter for general adult medical examination without abnormal findings: Secondary | ICD-10-CM | POA: Diagnosis not present

## 2020-10-27 DIAGNOSIS — I10 Essential (primary) hypertension: Secondary | ICD-10-CM | POA: Diagnosis not present

## 2020-10-27 DIAGNOSIS — Z125 Encounter for screening for malignant neoplasm of prostate: Secondary | ICD-10-CM | POA: Diagnosis not present

## 2020-12-29 DIAGNOSIS — R0789 Other chest pain: Secondary | ICD-10-CM | POA: Diagnosis not present

## 2020-12-29 DIAGNOSIS — I252 Old myocardial infarction: Secondary | ICD-10-CM | POA: Diagnosis not present

## 2020-12-29 DIAGNOSIS — R7989 Other specified abnormal findings of blood chemistry: Secondary | ICD-10-CM | POA: Diagnosis not present

## 2020-12-29 DIAGNOSIS — Z86718 Personal history of other venous thrombosis and embolism: Secondary | ICD-10-CM | POA: Diagnosis not present

## 2020-12-29 DIAGNOSIS — R079 Chest pain, unspecified: Secondary | ICD-10-CM | POA: Diagnosis not present

## 2020-12-29 DIAGNOSIS — R0602 Shortness of breath: Secondary | ICD-10-CM | POA: Diagnosis not present

## 2020-12-29 DIAGNOSIS — Z79899 Other long term (current) drug therapy: Secondary | ICD-10-CM | POA: Diagnosis not present

## 2020-12-29 DIAGNOSIS — E785 Hyperlipidemia, unspecified: Secondary | ICD-10-CM | POA: Diagnosis not present

## 2020-12-29 DIAGNOSIS — I10 Essential (primary) hypertension: Secondary | ICD-10-CM | POA: Diagnosis not present

## 2021-10-07 DIAGNOSIS — L02233 Carbuncle of chest wall: Secondary | ICD-10-CM | POA: Diagnosis not present

## 2021-10-10 DIAGNOSIS — L02412 Cutaneous abscess of left axilla: Secondary | ICD-10-CM | POA: Diagnosis not present

## 2021-11-01 DIAGNOSIS — Z0000 Encounter for general adult medical examination without abnormal findings: Secondary | ICD-10-CM | POA: Diagnosis not present

## 2021-11-01 DIAGNOSIS — E7800 Pure hypercholesterolemia, unspecified: Secondary | ICD-10-CM | POA: Diagnosis not present

## 2021-11-01 DIAGNOSIS — I10 Essential (primary) hypertension: Secondary | ICD-10-CM | POA: Diagnosis not present

## 2021-11-01 DIAGNOSIS — Z125 Encounter for screening for malignant neoplasm of prostate: Secondary | ICD-10-CM | POA: Diagnosis not present

## 2021-12-04 DIAGNOSIS — L03319 Cellulitis of trunk, unspecified: Secondary | ICD-10-CM | POA: Diagnosis not present

## 2022-01-22 DIAGNOSIS — L720 Epidermal cyst: Secondary | ICD-10-CM | POA: Diagnosis not present

## 2022-01-22 DIAGNOSIS — D225 Melanocytic nevi of trunk: Secondary | ICD-10-CM | POA: Diagnosis not present

## 2022-01-22 DIAGNOSIS — D485 Neoplasm of uncertain behavior of skin: Secondary | ICD-10-CM | POA: Diagnosis not present

## 2022-01-22 DIAGNOSIS — D2370 Other benign neoplasm of skin of unspecified lower limb, including hip: Secondary | ICD-10-CM | POA: Diagnosis not present

## 2022-02-16 DIAGNOSIS — D2272 Melanocytic nevi of left lower limb, including hip: Secondary | ICD-10-CM | POA: Diagnosis not present

## 2022-02-16 DIAGNOSIS — L918 Other hypertrophic disorders of the skin: Secondary | ICD-10-CM | POA: Diagnosis not present

## 2022-02-16 DIAGNOSIS — D225 Melanocytic nevi of trunk: Secondary | ICD-10-CM | POA: Diagnosis not present

## 2022-02-16 DIAGNOSIS — L814 Other melanin hyperpigmentation: Secondary | ICD-10-CM | POA: Diagnosis not present

## 2022-02-16 DIAGNOSIS — D485 Neoplasm of uncertain behavior of skin: Secondary | ICD-10-CM | POA: Diagnosis not present

## 2022-02-16 DIAGNOSIS — B078 Other viral warts: Secondary | ICD-10-CM | POA: Diagnosis not present

## 2022-07-06 ENCOUNTER — Telehealth: Payer: Self-pay | Admitting: *Deleted

## 2022-07-06 NOTE — Patient Outreach (Signed)
  Care Coordination   07/06/2022 Name: Julian White MRN: [id number] DOB: March 31, 1961   Care Coordination Outreach Attempts:  An unsuccessful telephone outreach was attempted today to offer the patient information about available care coordination services as a benefit of their health plan.   Follow Up Plan:  Additional outreach attempts will be made to offer the patient care coordination information and services.   Encounter Outcome:  No Answer   Care Coordination Interventions:  No, not indicated    Elliot Cousin, RN Care Management Coordinator [employer] Main Office [phone]

## 2022-08-21 DIAGNOSIS — R5383 Other fatigue: Secondary | ICD-10-CM | POA: Diagnosis not present

## 2022-08-21 DIAGNOSIS — R630 Anorexia: Secondary | ICD-10-CM | POA: Diagnosis not present

## 2022-08-21 DIAGNOSIS — I10 Essential (primary) hypertension: Secondary | ICD-10-CM | POA: Diagnosis not present

## 2022-08-21 DIAGNOSIS — M79604 Pain in right leg: Secondary | ICD-10-CM | POA: Diagnosis not present

## 2022-09-12 DIAGNOSIS — R7989 Other specified abnormal findings of blood chemistry: Secondary | ICD-10-CM | POA: Diagnosis not present

## 2022-09-12 DIAGNOSIS — R748 Abnormal levels of other serum enzymes: Secondary | ICD-10-CM | POA: Diagnosis not present

## 2022-09-12 DIAGNOSIS — I10 Essential (primary) hypertension: Secondary | ICD-10-CM | POA: Diagnosis not present

## 2022-09-12 DIAGNOSIS — D649 Anemia, unspecified: Secondary | ICD-10-CM | POA: Diagnosis not present

## 2022-09-12 DIAGNOSIS — Z79899 Other long term (current) drug therapy: Secondary | ICD-10-CM | POA: Diagnosis not present

## 2022-09-13 ENCOUNTER — Other Ambulatory Visit: Payer: Self-pay | Admitting: Family Medicine

## 2022-09-13 DIAGNOSIS — R748 Abnormal levels of other serum enzymes: Secondary | ICD-10-CM

## 2022-09-13 DIAGNOSIS — R0602 Shortness of breath: Secondary | ICD-10-CM

## 2022-09-17 ENCOUNTER — Ambulatory Visit (INDEPENDENT_AMBULATORY_CARE_PROVIDER_SITE_OTHER): Payer: Federal, State, Local not specified - PPO

## 2022-09-17 ENCOUNTER — Observation Stay (HOSPITAL_COMMUNITY)
Admission: EM | Admit: 2022-09-17 | Discharge: 2022-09-18 | Disposition: A | Discharge status: Home / Self Care | Payer: Federal, State, Local not specified - PPO | Attending: Family Medicine | Admitting: Family Medicine

## 2022-09-17 ENCOUNTER — Encounter (HOSPITAL_COMMUNITY): Payer: Self-pay

## 2022-09-17 ENCOUNTER — Other Ambulatory Visit: Payer: Self-pay

## 2022-09-17 ENCOUNTER — Emergency Department (HOSPITAL_COMMUNITY): Payer: Federal, State, Local not specified - PPO

## 2022-09-17 DIAGNOSIS — R0602 Shortness of breath: Secondary | ICD-10-CM | POA: Diagnosis not present

## 2022-09-17 DIAGNOSIS — R748 Abnormal levels of other serum enzymes: Secondary | ICD-10-CM

## 2022-09-17 DIAGNOSIS — I2699 Other pulmonary embolism without acute cor pulmonale: Secondary | ICD-10-CM

## 2022-09-17 DIAGNOSIS — I82461 Acute embolism and thrombosis of right calf muscular vein: Secondary | ICD-10-CM | POA: Insufficient documentation

## 2022-09-17 DIAGNOSIS — R634 Abnormal weight loss: Secondary | ICD-10-CM

## 2022-09-17 DIAGNOSIS — I2510 Atherosclerotic heart disease of native coronary artery without angina pectoris: Secondary | ICD-10-CM | POA: Diagnosis not present

## 2022-09-17 DIAGNOSIS — Z79899 Other long term (current) drug therapy: Secondary | ICD-10-CM | POA: Diagnosis not present

## 2022-09-17 DIAGNOSIS — Z86718 Personal history of other venous thrombosis and embolism: Secondary | ICD-10-CM

## 2022-09-17 DIAGNOSIS — I2609 Other pulmonary embolism with acute cor pulmonale: Secondary | ICD-10-CM | POA: Diagnosis not present

## 2022-09-17 DIAGNOSIS — R630 Anorexia: Secondary | ICD-10-CM

## 2022-09-17 DIAGNOSIS — R42 Dizziness and giddiness: Secondary | ICD-10-CM | POA: Diagnosis not present

## 2022-09-17 DIAGNOSIS — I10 Essential (primary) hypertension: Secondary | ICD-10-CM | POA: Diagnosis not present

## 2022-09-17 DIAGNOSIS — R5383 Other fatigue: Secondary | ICD-10-CM | POA: Diagnosis not present

## 2022-09-17 DIAGNOSIS — R911 Solitary pulmonary nodule: Secondary | ICD-10-CM | POA: Diagnosis not present

## 2022-09-17 LAB — CBC WITH DIFFERENTIAL/PLATELET
Abs Immature Granulocytes: 0.02 10*3/uL (ref 0.00–0.07)
Basophils Absolute: 0.1 10*3/uL (ref 0.0–0.1)
Basophils Relative: 1 %
Eosinophils Absolute: 0.1 10*3/uL (ref 0.0–0.5)
Eosinophils Relative: 1 %
HCT: 39.7 % (ref 39.0–52.0)
Hemoglobin: 12.9 g/dL — ABNORMAL LOW (ref 13.0–17.0)
Immature Granulocytes: 0 %
Lymphocytes Relative: 38 %
Lymphs Abs: 2.6 10*3/uL (ref 0.7–4.0)
MCH: 30.4 pg (ref 26.0–34.0)
MCHC: 32.5 g/dL (ref 30.0–36.0)
MCV: 93.4 fL (ref 80.0–100.0)
Monocytes Absolute: 0.5 10*3/uL (ref 0.1–1.0)
Monocytes Relative: 7 %
Neutro Abs: 3.6 10*3/uL (ref 1.7–7.7)
Neutrophils Relative %: 53 %
Platelets: 251 10*3/uL (ref 150–400)
RBC: 4.25 MIL/uL (ref 4.22–5.81)
RDW: 13.1 % (ref 11.5–15.5)
WBC: 6.9 10*3/uL (ref 4.0–10.5)
nRBC: 0 % (ref 0.0–0.2)

## 2022-09-17 LAB — TROPONIN I (HIGH SENSITIVITY)
Troponin I (High Sensitivity): <2 ng/L (ref <18)
Troponin I (High Sensitivity): 2 ng/L (ref <18)

## 2022-09-17 LAB — COMPREHENSIVE METABOLIC PANEL
ALT: 18 U/L (ref 0–44)
AST: 23 U/L (ref 15–41)
Albumin: 4 g/dL (ref 3.5–5.0)
Alkaline Phosphatase: 100 U/L (ref 38–126)
Anion gap: 8 (ref 5–15)
BUN: 12 mg/dL (ref 6–20)
CO2: 26 mmol/L (ref 22–32)
Calcium: 8.9 mg/dL (ref 8.9–10.3)
Chloride: 106 mmol/L (ref 98–111)
Creatinine, Ser: 1.04 mg/dL (ref 0.61–1.24)
GFR, Estimated: >60 mL/min (ref >60)
Glucose, Bld: 105 mg/dL — ABNORMAL HIGH (ref 70–99)
Potassium: 4 mmol/L (ref 3.5–5.1)
Sodium: 140 mmol/L (ref 135–145)
Total Bilirubin: 0.9 mg/dL (ref 0.3–1.2)
Total Protein: 7.7 g/dL (ref 6.5–8.1)

## 2022-09-17 LAB — HEPARIN LEVEL (UNFRACTIONATED): Heparin Unfractionated: 0.59 IU/mL (ref 0.30–0.70)

## 2022-09-17 MED ORDER — ALBUTEROL SULFATE (2.5 MG/3ML) 0.083% IN NEBU: 2.5000 mg | INHALATION_SOLUTION | RESPIRATORY_TRACT | Status: DC | PRN

## 2022-09-17 MED ORDER — IOHEXOL 350 MG/ML SOLN
100.0000 mL | Freq: Once | INTRAVENOUS | Status: AC | PRN
  Administered 2022-09-17: 100 mL via INTRAVENOUS

## 2022-09-17 MED ORDER — OXYCODONE-ACETAMINOPHEN 5-325 MG PO TABS: 1.0000 | ORAL_TABLET | Freq: Four times a day (QID) | ORAL | Status: DC | PRN

## 2022-09-17 MED ORDER — DOCUSATE SODIUM 100 MG PO CAPS: 100.0000 mg | ORAL_CAPSULE | Freq: Two times a day (BID) | ORAL | Status: DC

## 2022-09-17 MED ORDER — ACETAMINOPHEN 325 MG PO TABS: 650.0000 mg | ORAL_TABLET | Freq: Four times a day (QID) | ORAL | Status: DC | PRN

## 2022-09-17 MED ORDER — HEPARIN (PORCINE) 25000 UT/250ML-% IV SOLN
1300.0000 [IU]/h | INTRAVENOUS | Status: AC
  Administered 2022-09-17: 1400 [IU]/h via INTRAVENOUS
  Administered 2022-09-18: 1300 [IU]/h via INTRAVENOUS
  Filled 2022-09-17 (×2): qty 250

## 2022-09-17 MED ORDER — HEPARIN BOLUS VIA INFUSION
2500.0000 [IU] | Freq: Once | INTRAVENOUS | Status: AC
  Administered 2022-09-17: 2500 [IU] via INTRAVENOUS
  Filled 2022-09-17: qty 2500

## 2022-09-17 MED ORDER — ACETAMINOPHEN 650 MG RE SUPP: 650.0000 mg | Freq: Four times a day (QID) | RECTAL | Status: DC | PRN

## 2022-09-17 MED ORDER — ONDANSETRON HCL 4 MG/2ML IJ SOLN: 4.0000 mg | Freq: Four times a day (QID) | INTRAMUSCULAR | Status: DC | PRN

## 2022-09-17 MED ORDER — POLYETHYLENE GLYCOL 3350 17 G PO PACK: 17.0000 g | PACK | Freq: Every day | ORAL | Status: DC | PRN

## 2022-09-17 MED ORDER — ONDANSETRON HCL 4 MG PO TABS: 4.0000 mg | ORAL_TABLET | Freq: Four times a day (QID) | ORAL | Status: DC | PRN

## 2022-09-17 NOTE — Progress Notes (Signed)
ANTICOAGULATION CONSULT NOTE - follow up  Pharmacy Consult for IV heparin Indication: pulmonary embolus  No Known Allergies  Patient Measurements: Height: 5' 11"$  (180.3 cm) Weight: 77.1 kg (170 lb) IBW/kg (Calculated) : 75.3 Heparin Dosing Weight: 77 kg  Vital Signs: Temp: 98.8 F (37.1 C) (02/19 2037) Temp Source: Oral (02/19 2037) BP: 121/86 (02/19 2037) Pulse Rate: 74 (02/19 2037)  Labs: Recent Labs    09/17/22 1241 09/17/22 1632 09/17/22 2134  HGB 12.9*  --   --   HCT 39.7  --   --   PLT 251  --   --   HEPARINUNFRC  --   --  0.59  CREATININE 1.04  --   --   TROPONINIHS <2 2  --     Estimated Creatinine Clearance: 81.5 mL/min (by C-G formula based on SCr of 1.04 mg/dL).   Medical History: Past Medical History:  Diagnosis Date   Anginal pain ([place])    CAD (coronary artery disease)    minimal plaque by LHC 11/13:LHC 06/27/12:  dLM 20%, proximal and mid LAD 20%, EF 55% with normal wall motion     Headache(784.0)    Heart murmur    Hx of echocardiogram    Echo 06/27/12: Mild LVH, EF 65-70%.   Hypertension    per pt dx 20 years but has not had any meds in 20 yrs per md   NSTEMI (non-ST elevated myocardial infarction) ([place])    05/2012:  Type 2 in setting of SVT (HR 190s)   SVT (supraventricular tachycardia)     Medications:  Scheduled:  Infusions:   Assessment: 62 yo who has been fatigued, dizzy, short of breath for last 6 weeks and per CT discovered a new PE. Not on anticoagulant prior to admission. Baseline labs ordered but have not been drawn yet.   1st HL 0.59 therapeutic on 1400 units/hr No bleeding noted  Goal of Therapy:  Heparin level 0.3-0.7 units/ml Monitor platelets by anticoagulation protocol: Yes   Plan:  Continue heparin drip at 1400 units/hr Confirmatory level in 6 hours Daily CBC  Dolly Rias RPh 09/17/2022, 10:32 PM

## 2022-09-17 NOTE — ED Provider Notes (Signed)
[facility] Provider Note   CSN: [id number] Arrival date & time: 09/17/22  1218     History  Chief Complaint  Patient presents with   Abnormal CT    Julian White is a 62 y.o. male history of NSTEMI presented with an abnormal CT today that showed bilateral submassive pulmonary embolisms.  Patient stated he has been having shortness of breath for 6 weeks associated with fatigue, dizziness, weight loss.  Patient stated he is unable to take a deep breath.  Patient endorsed chest discomfort with deep breaths but did not radiate.  Patient denied blood thinners  Patient denied syncope, headache, vision changes, hemoptysis, nausea/vomiting, clotting disorder, cancer  Home Medications Prior to Admission medications   Medication Sig Start Date End Date Taking? Authorizing Provider  amLODipine-benazepril (LOTREL) 5-10 MG capsule Take 1 capsule by mouth daily. 11/15/19   [provider]  aspirin 81 MG chewable tablet Chew 1 tablet (81 mg total) by mouth daily. Patient not taking: Reported on 01/13/2020 06/26/12   Powers, Lonia Farber, MD  oxyCODONE-acetaminophen (PERCOCET/ROXICET) 5-325 MG tablet Take 1 tablet by mouth 4 (four) times daily as needed (pain).  01/12/20   [provider]  rosuvastatin (CRESTOR) 5 MG tablet Take 5 mg by mouth daily. 11/15/19   [provider]  simvastatin (ZOCOR) 20 MG tablet Take 1 tablet (20 mg total) by mouth daily at 6 PM. Patient not taking: Reported on 01/13/2020 06/27/12   Meriel Pica, PA-C      Allergies    Patient has no known allergies.    Review of Systems   Review of Systems See HPI Physical Exam Updated Vital Signs BP (!) 129/94   Pulse 72   Temp 98.7 F (37.1 C) (Oral)   Resp 17   Ht 5' 11"$  (1.803 m)   Wt 77.1 kg   SpO2 97%   BMI 23.71 kg/m  Physical Exam Vitals and nursing note reviewed.  Constitutional:      General: He is not in acute distress.     Appearance: He is well-developed.  HENT:     Head: Normocephalic and atraumatic.  Eyes:     Conjunctiva/sclera: Conjunctivae normal.  Cardiovascular:     Rate and Rhythm: Normal rate and regular rhythm.     Heart sounds: No murmur heard. Pulmonary:     Effort: Pulmonary effort is normal. No respiratory distress.     Breath sounds: Normal breath sounds.  Abdominal:     Palpations: Abdomen is soft.     Tenderness: There is no abdominal tenderness.  Musculoskeletal:        General: No swelling. Normal range of motion.     Cervical back: Normal range of motion and neck supple.  Skin:    General: Skin is warm and dry.     Capillary Refill: Capillary refill takes less than 2 seconds.  Neurological:     General: No focal deficit present.     Mental Status: He is alert and oriented to person, place, and time.  Psychiatric:        Mood and Affect: Mood normal.     ED Results / Procedures / Treatments   Labs (all labs ordered are listed, but only abnormal results are displayed) Labs Reviewed  COMPREHENSIVE METABOLIC PANEL - Abnormal; Notable for the following components:      Result Value   Glucose, Bld 105 (*)    All other components within normal limits  CBC WITH DIFFERENTIAL/PLATELET - Abnormal; Notable for the following components:   Hemoglobin 12.9 (*)    All other components within normal limits  HEPARIN LEVEL (UNFRACTIONATED)  HIV ANTIBODY (ROUTINE TESTING W REFLEX)  TROPONIN I (HIGH SENSITIVITY)  TROPONIN I (HIGH SENSITIVITY)    EKG EKG Interpretation  Date/Time:  Monday September 17 2022 13:10:31 EST Ventricular Rate:  78 PR Interval:  159 QRS Duration: 91 QT Interval:  401 QTC Calculation: 457 R Axis:   37 Text Interpretation: Sinus rhythm Confirmed by Dorie Rank [id number]) on 09/17/2022 1:19:35 PM  Radiology DG Chest 1 View  Result Date: 09/17/2022 CLINICAL DATA:  Shortness of breath. EXAM: CHEST  1 VIEW COMPARISON:  Chest radiographs 06/26/2012.  Chest CTA  09/17/2012. FINDINGS: The cardiomediastinal silhouette is unchanged with normal heart size. The small area of consolidation in the basilar right lower lobe on today's CTA is not well seen radiographically. The lungs are clear elsewhere. No sizable pleural effusion or pneumothorax is identified. No acute osseous abnormality is seen. IMPRESSION: Known basilar right lower lobe consolidation/suspected infarct on CT is not well shown by this radiograph. Lungs are otherwise clear. Electronically Signed   By: Logan Bores M.D.   On: 09/17/2022 13:09   CT Angio Chest Pulmonary Embolism (PE) W or WO Contrast  Addendum Date: 09/17/2022   ADDENDUM REPORT: 09/17/2022 11:38 ADDENDUM: Critical Value/emergent results were called by telephone at the time of interpretation on 09/17/2022 at 11:31 am to provider BRENT BREEDLOVE , who verbally acknowledged these results. Electronically Signed   By: Van Clines M.D.   On: 09/17/2022 11:38   Result Date: 09/17/2022 CLINICAL DATA:  History of blood clots. Loss of appetite and weight loss. EXAM: CT ANGIOGRAPHY CHEST CT ABDOMEN AND PELVIS WITH CONTRAST TECHNIQUE: Multidetector CT imaging of the chest was performed using the standard protocol during bolus administration of intravenous contrast. Multiplanar CT image reconstructions and MIPs were obtained to evaluate the vascular anatomy. Multidetector CT imaging of the abdomen and pelvis was performed using the standard protocol during bolus administration of intravenous contrast. RADIATION DOSE REDUCTION: This exam was performed according to the departmental dose-optimization program which includes automated exposure control, adjustment of the mA and/or kV according to patient size and/or use of iterative reconstruction technique. CONTRAST:  179m OMNIPAQUE IOHEXOL 350 MG/ML SOLN COMPARISON:  CT abdomen 07/21/2009 FINDINGS: CTA CHEST FINDINGS Cardiovascular: Filling defects compatible with acute pulmonary embolus observed in the  right lower lobe and left lower lobe at lobar and segmental levels. Clot burden moderate. Right ventricular to left ventricular ratio elevated at 1.17, indicating right heart strain. Mild atheromatous calcification of the aortic arch and left anterior descending coronary artery. Mediastinum/Nodes: Unremarkable Lungs/Pleura: Wedge shaped consolidation in the posterior basal segment right lower lobe peripherally at 2.7 by 1.4 cm on image 59 series 5, probably a pulmonary infarct given the morphology and location, but possibly from pulmonary hemorrhage. Based on morphology this is substantially less likely to be a pulmonary malignancy. Right lower lobe subpleural nodule measures 7 by 6 by 6 mm (volume = 100 mm^3) on image 42 series 5. Musculoskeletal: Mild levoconvex upper thoracic scoliosis. Lower thoracic spondylosis. Review of the MIP images confirms the above findings. CT ABDOMEN and PELVIS FINDINGS Hepatobiliary: Sharply defined fluid density 1.2 by 1.0 cm left hepatic lobe lesion on image 22 series 11 compatible with cyst. No further imaging workup of this lesion is indicated. 0.4 cm hypodense lesion inferiorly in the right hepatic lobe on image 41 series 11 is  sharply defined and probably also a cyst, although technically too small to characterize. No further imaging workup of this lesion is indicated. Gallbladder unremarkable.  No biliary dilatation. Pancreas: Unremarkable Spleen: Unremarkable Adrenals/Urinary Tract: Bilateral Bosniak category 1 cysts of both kidneys. No further imaging workup of these lesions is indicated. Right kidney lower pole 1.1 by 0.7 by 0.8 cm Bosniak category 2 cyst, internal density 28 Hounsfield units, calcifications along the margin. No further imaging workup of this lesion is indicated. Both adrenal glands appear normal. Stomach/Bowel: Mild sigmoid colon diverticulosis. Vascular/Lymphatic: Upper normal size right gastric node 0.9 cm in short axis, image 27 series 11. Moderate  aortic and branch vessel atheromatous vascular calcifications. Reproductive: Prostatomegaly, prostate gland measures 6.2 by 4.4 by 5.0 cm (volume = 71 cm^3). Other: No supplemental non-categorized findings. Musculoskeletal: Lower lumbar spondylosis and degenerative disc disease resulting in bilateral foraminal impingement at L3-4 and L4-5, and right foraminal impingement at L5-S1. Review of the MIP images confirms the above findings. IMPRESSION: 1. Acute bilateral lower lobe pulmonary emboli. Clot burden moderate. Positive for acute PE with CT evidence of right heart strain (RV/LV Ratio = 1.17 ) consistent with at least submassive (intermediate risk) PE. The presence of right heart strain has been associated with an increased risk of morbidity and mortality. Please refer to the "PE Focused" order set in EPIC. 2. Wedge shaped consolidation in the posterior basal segment right lower lobe, probably a pulmonary infarct given the morphology and location, but possibly from pulmonary hemorrhage. 3. 7 mm right lower lobe subpleural nodule. Non-contrast chest CT at 6-12 months is recommended. If the nodule is stable at time of repeat CT, then future CT at 18-24 months (from today's scan) is considered optional for low-risk patients, but is recommended for high-risk patients. This recommendation follows the consensus statement: Guidelines for Management of Incidental Pulmonary Nodules Detected on CT Images: From the Fleischner Society 2017; Radiology 2017; 284:228-243. 4. Aortic Atherosclerosis (ICD10-I70.0). Radiology assistant personnel have been notified to put me in telephone contact with the referring physician or the referring physician's clinical representative in order to discuss these findings. Once this communication is established I will issue an addendum to this report for documentation purposes. Electronically Signed: By: Van Clines M.D. On: 09/17/2022 11:27   CT ABDOMEN PELVIS W CONTRAST  Addendum  Date: 09/17/2022   ADDENDUM REPORT: 09/17/2022 11:38 ADDENDUM: Critical Value/emergent results were called by telephone at the time of interpretation on 09/17/2022 at 11:31 am to provider BRENT BREEDLOVE , who verbally acknowledged these results. Electronically Signed   By: Van Clines M.D.   On: 09/17/2022 11:38   Result Date: 09/17/2022 CLINICAL DATA:  History of blood clots. Loss of appetite and weight loss. EXAM: CT ANGIOGRAPHY CHEST CT ABDOMEN AND PELVIS WITH CONTRAST TECHNIQUE: Multidetector CT imaging of the chest was performed using the standard protocol during bolus administration of intravenous contrast. Multiplanar CT image reconstructions and MIPs were obtained to evaluate the vascular anatomy. Multidetector CT imaging of the abdomen and pelvis was performed using the standard protocol during bolus administration of intravenous contrast. RADIATION DOSE REDUCTION: This exam was performed according to the departmental dose-optimization program which includes automated exposure control, adjustment of the mA and/or kV according to patient size and/or use of iterative reconstruction technique. CONTRAST:  155m OMNIPAQUE IOHEXOL 350 MG/ML SOLN COMPARISON:  CT abdomen 07/21/2009 FINDINGS: CTA CHEST FINDINGS Cardiovascular: Filling defects compatible with acute pulmonary embolus observed in the right lower lobe and left lower lobe at lobar and  segmental levels. Clot burden moderate. Right ventricular to left ventricular ratio elevated at 1.17, indicating right heart strain. Mild atheromatous calcification of the aortic arch and left anterior descending coronary artery. Mediastinum/Nodes: Unremarkable Lungs/Pleura: Wedge shaped consolidation in the posterior basal segment right lower lobe peripherally at 2.7 by 1.4 cm on image 59 series 5, probably a pulmonary infarct given the morphology and location, but possibly from pulmonary hemorrhage. Based on morphology this is substantially less likely to be a  pulmonary malignancy. Right lower lobe subpleural nodule measures 7 by 6 by 6 mm (volume = 100 mm^3) on image 42 series 5. Musculoskeletal: Mild levoconvex upper thoracic scoliosis. Lower thoracic spondylosis. Review of the MIP images confirms the above findings. CT ABDOMEN and PELVIS FINDINGS Hepatobiliary: Sharply defined fluid density 1.2 by 1.0 cm left hepatic lobe lesion on image 22 series 11 compatible with cyst. No further imaging workup of this lesion is indicated. 0.4 cm hypodense lesion inferiorly in the right hepatic lobe on image 41 series 11 is sharply defined and probably also a cyst, although technically too small to characterize. No further imaging workup of this lesion is indicated. Gallbladder unremarkable.  No biliary dilatation. Pancreas: Unremarkable Spleen: Unremarkable Adrenals/Urinary Tract: Bilateral Bosniak category 1 cysts of both kidneys. No further imaging workup of these lesions is indicated. Right kidney lower pole 1.1 by 0.7 by 0.8 cm Bosniak category 2 cyst, internal density 28 Hounsfield units, calcifications along the margin. No further imaging workup of this lesion is indicated. Both adrenal glands appear normal. Stomach/Bowel: Mild sigmoid colon diverticulosis. Vascular/Lymphatic: Upper normal size right gastric node 0.9 cm in short axis, image 27 series 11. Moderate aortic and branch vessel atheromatous vascular calcifications. Reproductive: Prostatomegaly, prostate gland measures 6.2 by 4.4 by 5.0 cm (volume = 71 cm^3). Other: No supplemental non-categorized findings. Musculoskeletal: Lower lumbar spondylosis and degenerative disc disease resulting in bilateral foraminal impingement at L3-4 and L4-5, and right foraminal impingement at L5-S1. Review of the MIP images confirms the above findings. IMPRESSION: 1. Acute bilateral lower lobe pulmonary emboli. Clot burden moderate. Positive for acute PE with CT evidence of right heart strain (RV/LV Ratio = 1.17 ) consistent with at  least submassive (intermediate risk) PE. The presence of right heart strain has been associated with an increased risk of morbidity and mortality. Please refer to the "PE Focused" order set in EPIC. 2. Wedge shaped consolidation in the posterior basal segment right lower lobe, probably a pulmonary infarct given the morphology and location, but possibly from pulmonary hemorrhage. 3. 7 mm right lower lobe subpleural nodule. Non-contrast chest CT at 6-12 months is recommended. If the nodule is stable at time of repeat CT, then future CT at 18-24 months (from today's scan) is considered optional for low-risk patients, but is recommended for high-risk patients. This recommendation follows the consensus statement: Guidelines for Management of Incidental Pulmonary Nodules Detected on CT Images: From the Fleischner Society 2017; Radiology 2017; 284:228-243. 4. Aortic Atherosclerosis (ICD10-I70.0). Radiology assistant personnel have been notified to put me in telephone contact with the referring physician or the referring physician's clinical representative in order to discuss these findings. Once this communication is established I will issue an addendum to this report for documentation purposes. Electronically Signed: By: Van Clines M.D. On: 09/17/2022 11:27    Procedures .Critical Care  Performed by: Chuck Hint, PA-C Authorized by: Chuck Hint, PA-C   Critical care provider statement:    Critical care time (minutes):  30   Critical care  time was exclusive of:  Separately billable procedures and treating other patients   Critical care was necessary to treat or prevent imminent or life-threatening deterioration of the following conditions:  Circulatory failure   Critical care was time spent personally by me on the following activities:  Development of treatment plan with patient or surrogate, discussions with consultants, evaluation of patient's response to treatment, examination of patient,  obtaining history from patient or surrogate, review of old charts, re-evaluation of patient's condition, pulse oximetry, ordering and review of radiographic studies, ordering and review of laboratory studies and ordering and performing treatments and interventions   I assumed direction of critical care for this patient from another provider in my specialty: no     Care discussed with: admitting provider       Medications Ordered in ED Medications  heparin ADULT infusion 100 units/mL (25000 units/262m) (1,400 Units/hr Intravenous New Bag/Given 09/17/22 1323)  oxyCODONE-acetaminophen (PERCOCET/ROXICET) 5-325 MG per tablet 1 tablet (has no administration in time range)  acetaminophen (TYLENOL) tablet 650 mg (has no administration in time range)    Or  acetaminophen (TYLENOL) suppository 650 mg (has no administration in time range)  docusate sodium (COLACE) capsule 100 mg (has no administration in time range)  polyethylene glycol (MIRALAX / GLYCOLAX) packet 17 g (has no administration in time range)  ondansetron (ZOFRAN) tablet 4 mg (has no administration in time range)    Or  ondansetron (ZOFRAN) injection 4 mg (has no administration in time range)  albuterol (PROVENTIL) (2.5 MG/3ML) 0.083% nebulizer solution 2.5 mg (has no administration in time range)  heparin bolus via infusion 2,500 Units (2,500 Units Intravenous Bolus from Bag 09/17/22 1315)    ED Course/ Medical Decision Making/ A&P                             Medical Decision Making Amount and/or Complexity of Data Reviewed Labs: ordered. Radiology: ordered.  Risk Prescription drug management.   [facility] presented today for abnormal CT that showed bilateral submassive pulmonary embolisms  Review of prior external notes: 09/13/2022 CT abdomen pelvis with contrast  Unique Tests and My Interpretation:  Troponin: Less than 2 CBC: Unremarkable CMP: Unremarkable Chest x-ray: No acute cardiopulmonary changes  noted EKG: Sinus rhythm to 74 bpm without ST abnormalities or blocks noted  Discussion with Independent Historian: None  Discussion of Management of Tests: IHollice Gong MD Hospitalist  Risk: High:  - hospitalization or escalation of hospital-level care  Risk Stratification Score: none  Staffed with JDorie Rank MD  Plan: Patient presented for normal CT that was positive for PE.  Patient does not appear to be in any distress and vitals are stable.  Labs were ordered along with a chest x-ray, EKG and patient received heparin per pharmacy.  Hospitalist will be consulted for inpatient care due to bilateral pulmonary embolus.  Troponin is not elevated at this time to indicate any heart strain.  Patient stable at this time.  After speak with the hospitalist patient was accepted for inpatient care.  Patient is stable at this time for admission.         Final Clinical Impression(s) / ED Diagnoses Final diagnoses:  Bilateral pulmonary embolism ([facility]    Rx / DC Orders ED Discharge Orders     None         SElvina Sidle02/19/24 1457    KDorie Rank MD 09/18/22 [phone]

## 2022-09-17 NOTE — ED Triage Notes (Addendum)
Patient got a CT scan this morning and was told he has 2 blood clots in his lungs. Said for the last 6 weeks he has been fatigued, dizzy, shallow breathing, lost 9 pounds. Feeling like he is unable to take a deep breath. Not taking any blood thinners.

## 2022-09-17 NOTE — H&P (Signed)
History and Physical  Julian White [facility] [id number] DOB: 05/18/1961 DOA: 09/17/2022   PCP: Orpah Melter, MD   Patient coming from: Home   Chief Complaint: Weakness, SOB   HPI: Julian White is a 62 y.o. male with medical history significant for hypertension, acute PE in 2021 after surgery for a broken left arm who is being admitted to the hospital with newly diagnosed acute bilateral submassive PE discovered on outpatient CT scan today, after 6 months of lethargy, weight loss, shortness of breath.  History is provided by the patient, who states he was feeling well until about 6 weeks ago, when he started feeling lethargy, having difficulty feeling like he could take a deep breath.  Denies any frank chest pain, but for the last week or so, he has also had some discomfort behind the left calf.  Denies any fevers, chills, coughing up blood, or pleuritic chest pain.  Couple of years ago when he was diagnosed with acute PE after surgery on his left arm, he took Eliquis for 6 months without complication.  ED Course: After CT showed bilateral acute submassive PE, he was told to come to the ER for evaluation.  Here, vital signs are normal, saturating 100% on room air.  Lab work is unremarkable.  Given bilateral nature of his clot, as well as slightly elevated RV ratio, patient is being admitted for observation.  He has already been started on a heparin drip.  Review of Systems: Please see HPI for pertinent positives and negatives. A complete 10 system review of systems are otherwise negative.  Past Medical History:  Diagnosis Date   Anginal pain ([place])    CAD (coronary artery disease)    minimal plaque by LHC 11/13:LHC 06/27/12:  dLM 20%, proximal and mid LAD 20%, EF 55% with normal wall motion     Headache(784.0)    Heart murmur    Hx of echocardiogram    Echo 06/27/12: Mild LVH, EF 65-70%.   Hypertension    per pt dx 20 years but has not had any meds in 20 yrs per md   NSTEMI (non-ST  elevated myocardial infarction) ([place])    05/2012:  Type 2 in setting of SVT (HR 190s)   SVT (supraventricular tachycardia)    Past Surgical History:  Procedure Laterality Date   ABLATION     svt   CARDIAC CATHETERIZATION     COLONOSCOPY     LEFT HEART CATHETERIZATION WITH CORONARY ANGIOGRAM N/A 06/27/2012   Procedure: LEFT HEART CATHETERIZATION WITH CORONARY ANGIOGRAM;  Surgeon: Jolaine Artist, MD;  Location: [facility] CATH LAB;  Service: Cardiovascular;  Laterality: N/A;   ORIF ELBOW FRACTURE Left 01/13/2020   Procedure: OPEN REDUCTION INTERNAL FIXATION (ORIF) ELBOW ulna and radius FRACTURE;  Surgeon: Iran Planas, MD;  Location: Between;  Service: Orthopedics;  Laterality: Left;  with regional block   SUPRAVENTRICULAR TACHYCARDIA ABLATION N/A 07/17/2012   Procedure: SUPRAVENTRICULAR TACHYCARDIA ABLATION;  Surgeon: Evans Lance, MD;  Location: [facility] CATH LAB;  Service: Cardiovascular;  Laterality: N/A;    Social History:  reports that he has never smoked. He has never used smokeless tobacco. He reports that he does not drink alcohol and does not use drugs.   No Known Allergies  Family History  Problem Relation Age of Onset   CAD Brother        s/p MI in his 41s   CAD Father        s/p CABG at 24  Prior to Admission medications   Medication Sig Start Date End Date Taking? Authorizing Provider  amLODipine-benazepril (LOTREL) 5-10 MG capsule Take 1 capsule by mouth daily. 11/15/19   [provider]  aspirin 81 MG chewable tablet Chew 1 tablet (81 mg total) by mouth daily. Patient not taking: Reported on 01/13/2020 06/26/12   Powers, Lonia Farber, MD  oxyCODONE-acetaminophen (PERCOCET/ROXICET) 5-325 MG tablet Take 1 tablet by mouth 4 (four) times daily as needed (pain).  01/12/20   [provider]  rosuvastatin (CRESTOR) 5 MG tablet Take 5 mg by mouth daily. 11/15/19   [provider]  simvastatin (ZOCOR) 20 MG tablet Take 1 tablet (20 mg total) by mouth daily at 6  PM. Patient not taking: Reported on 01/13/2020 06/27/12   Meriel Pica, PA-C    Physical Exam: BP (!) 129/94   Pulse 72   Temp 98.7 F (37.1 C) (Oral)   Resp 17   Ht 5' 11"$  (1.803 m)   Wt 77.1 kg   SpO2 97%   BMI 23.71 kg/m   General:  Alert, oriented, calm, in no acute distress, resting comfortably speaking full sentences on room air, no cough Eyes: EOMI, clear conjuctivae, white sclerea Neck: supple, no masses, trachea mildline  Cardiovascular: RRR, no murmurs or rubs, no peripheral edema  Respiratory: clear to auscultation bilaterally, no wheezes, no crackles  Abdomen: soft, nontender, nondistended, normal bowel tones heard  Skin: dry, no rashes  Musculoskeletal: no joint effusions, normal range of motion  Psychiatric: appropriate affect, normal speech  Neurologic: extraocular muscles intact, clear speech, moving all extremities with intact sensorium          Labs on Admission:  Basic Metabolic Panel: Recent Labs  Lab 09/17/22 1241  NA 140  K 4.0  CL 106  CO2 26  GLUCOSE 105*  BUN 12  CREATININE 1.04  CALCIUM 8.9   Liver Function Tests: Recent Labs  Lab 09/17/22 1241  AST 23  ALT 18  ALKPHOS 100  BILITOT 0.9  PROT 7.7  ALBUMIN 4.0   No results for input(s): "LIPASE", "AMYLASE" in the last 168 hours. No results for input(s): "AMMONIA" in the last 168 hours. CBC: Recent Labs  Lab 09/17/22 1241  WBC 6.9  NEUTROABS 3.6  HGB 12.9*  HCT 39.7  MCV 93.4  PLT 251   Cardiac Enzymes: No results for input(s): "CKTOTAL", "CKMB", "CKMBINDEX", "TROPONINI" in the last 168 hours.  BNP (last 3 results) No results for input(s): "BNP" in the last 8760 hours.  ProBNP (last 3 results) No results for input(s): "PROBNP" in the last 8760 hours.  CBG: No results for input(s): "GLUCAP" in the last 168 hours.  Radiological Exams on Admission: DG Chest 1 View  Result Date: 09/17/2022 CLINICAL DATA:  Shortness of breath. EXAM: CHEST  1 VIEW COMPARISON:  Chest  radiographs 06/26/2012.  Chest CTA 09/17/2012. FINDINGS: The cardiomediastinal silhouette is unchanged with normal heart size. The small area of consolidation in the basilar right lower lobe on today's CTA is not well seen radiographically. The lungs are clear elsewhere. No sizable pleural effusion or pneumothorax is identified. No acute osseous abnormality is seen. IMPRESSION: Known basilar right lower lobe consolidation/suspected infarct on CT is not well shown by this radiograph. Lungs are otherwise clear. Electronically Signed   By: Logan Bores M.D.   On: 09/17/2022 13:09   CT Angio Chest Pulmonary Embolism (PE) W or WO Contrast  Addendum Date: 09/17/2022   ADDENDUM REPORT: 09/17/2022 11:38 ADDENDUM: Critical Value/emergent  results were called by telephone at the time of interpretation on 09/17/2022 at 11:31 am to provider [facility] , who verbally acknowledged these results. Electronically Signed   By: Van Clines M.D.   On: 09/17/2022 11:38   Result Date: 09/17/2022 CLINICAL DATA:  History of blood clots. Loss of appetite and weight loss. EXAM: CT ANGIOGRAPHY CHEST CT ABDOMEN AND PELVIS WITH CONTRAST TECHNIQUE: Multidetector CT imaging of the chest was performed using the standard protocol during bolus administration of intravenous contrast. Multiplanar CT image reconstructions and MIPs were obtained to evaluate the vascular anatomy. Multidetector CT imaging of the abdomen and pelvis was performed using the standard protocol during bolus administration of intravenous contrast. RADIATION DOSE REDUCTION: This exam was performed according to the departmental dose-optimization program which includes automated exposure control, adjustment of the mA and/or kV according to patient size and/or use of iterative reconstruction technique. CONTRAST:  115m OMNIPAQUE IOHEXOL 350 MG/ML SOLN COMPARISON:  CT abdomen 07/21/2009 FINDINGS: CTA CHEST FINDINGS Cardiovascular: Filling defects compatible with acute  pulmonary embolus observed in the right lower lobe and left lower lobe at lobar and segmental levels. Clot burden moderate. Right ventricular to left ventricular ratio elevated at 1.17, indicating right heart strain. Mild atheromatous calcification of the aortic arch and left anterior descending coronary artery. Mediastinum/Nodes: Unremarkable Lungs/Pleura: Wedge shaped consolidation in the posterior basal segment right lower lobe peripherally at 2.7 by 1.4 cm on image 59 series 5, probably a pulmonary infarct given the morphology and location, but possibly from pulmonary hemorrhage. Based on morphology this is substantially less likely to be a pulmonary malignancy. Right lower lobe subpleural nodule measures 7 by 6 by 6 mm (volume = 100 mm^3) on image 42 series 5. Musculoskeletal: Mild levoconvex upper thoracic scoliosis. Lower thoracic spondylosis. Review of the MIP images confirms the above findings. CT ABDOMEN and PELVIS FINDINGS Hepatobiliary: Sharply defined fluid density 1.2 by 1.0 cm left hepatic lobe lesion on image 22 series 11 compatible with cyst. No further imaging workup of this lesion is indicated. 0.4 cm hypodense lesion inferiorly in the right hepatic lobe on image 41 series 11 is sharply defined and probably also a cyst, although technically too small to characterize. No further imaging workup of this lesion is indicated. Gallbladder unremarkable.  No biliary dilatation. Pancreas: Unremarkable Spleen: Unremarkable Adrenals/Urinary Tract: Bilateral Bosniak category 1 cysts of both kidneys. No further imaging workup of these lesions is indicated. Right kidney lower pole 1.1 by 0.7 by 0.8 cm Bosniak category 2 cyst, internal density 28 Hounsfield units, calcifications along the margin. No further imaging workup of this lesion is indicated. Both adrenal glands appear normal. Stomach/Bowel: Mild sigmoid colon diverticulosis. Vascular/Lymphatic: Upper normal size right gastric node 0.9 cm in short axis,  image 27 series 11. Moderate aortic and branch vessel atheromatous vascular calcifications. Reproductive: Prostatomegaly, prostate gland measures 6.2 by 4.4 by 5.0 cm (volume = 71 cm^3). Other: No supplemental non-categorized findings. Musculoskeletal: Lower lumbar spondylosis and degenerative disc disease resulting in bilateral foraminal impingement at L3-4 and L4-5, and right foraminal impingement at L5-S1. Review of the MIP images confirms the above findings. IMPRESSION: 1. Acute bilateral lower lobe pulmonary emboli. Clot burden moderate. Positive for acute PE with CT evidence of right heart strain (RV/LV Ratio = 1.17 ) consistent with at least submassive (intermediate risk) PE. The presence of right heart strain has been associated with an increased risk of morbidity and mortality. Please refer to the "PE Focused" order set in EPIC. 2. Wedge shaped  consolidation in the posterior basal segment right lower lobe, probably a pulmonary infarct given the morphology and location, but possibly from pulmonary hemorrhage. 3. 7 mm right lower lobe subpleural nodule. Non-contrast chest CT at 6-12 months is recommended. If the nodule is stable at time of repeat CT, then future CT at 18-24 months (from today's scan) is considered optional for low-risk patients, but is recommended for high-risk patients. This recommendation follows the consensus statement: Guidelines for Management of Incidental Pulmonary Nodules Detected on CT Images: From the Fleischner Society 2017; Radiology 2017; 284:228-243. 4. Aortic Atherosclerosis (ICD10-I70.0). Radiology assistant personnel have been notified to put me in telephone contact with the referring physician or the referring physician's clinical representative in order to discuss these findings. Once this communication is established I will issue an addendum to this report for documentation purposes. Electronically Signed: By: Van Clines M.D. On: 09/17/2022 11:27   CT ABDOMEN  PELVIS W CONTRAST  Addendum Date: 09/17/2022   ADDENDUM REPORT: 09/17/2022 11:38 ADDENDUM: Critical Value/emergent results were called by telephone at the time of interpretation on 09/17/2022 at 11:31 am to provider BRENT BREEDLOVE , who verbally acknowledged these results. Electronically Signed   By: Van Clines M.D.   On: 09/17/2022 11:38   Result Date: 09/17/2022 CLINICAL DATA:  History of blood clots. Loss of appetite and weight loss. EXAM: CT ANGIOGRAPHY CHEST CT ABDOMEN AND PELVIS WITH CONTRAST TECHNIQUE: Multidetector CT imaging of the chest was performed using the standard protocol during bolus administration of intravenous contrast. Multiplanar CT image reconstructions and MIPs were obtained to evaluate the vascular anatomy. Multidetector CT imaging of the abdomen and pelvis was performed using the standard protocol during bolus administration of intravenous contrast. RADIATION DOSE REDUCTION: This exam was performed according to the departmental dose-optimization program which includes automated exposure control, adjustment of the mA and/or kV according to patient size and/or use of iterative reconstruction technique. CONTRAST:  153m OMNIPAQUE IOHEXOL 350 MG/ML SOLN COMPARISON:  CT abdomen 07/21/2009 FINDINGS: CTA CHEST FINDINGS Cardiovascular: Filling defects compatible with acute pulmonary embolus observed in the right lower lobe and left lower lobe at lobar and segmental levels. Clot burden moderate. Right ventricular to left ventricular ratio elevated at 1.17, indicating right heart strain. Mild atheromatous calcification of the aortic arch and left anterior descending coronary artery. Mediastinum/Nodes: Unremarkable Lungs/Pleura: Wedge shaped consolidation in the posterior basal segment right lower lobe peripherally at 2.7 by 1.4 cm on image 59 series 5, probably a pulmonary infarct given the morphology and location, but possibly from pulmonary hemorrhage. Based on morphology this is  substantially less likely to be a pulmonary malignancy. Right lower lobe subpleural nodule measures 7 by 6 by 6 mm (volume = 100 mm^3) on image 42 series 5. Musculoskeletal: Mild levoconvex upper thoracic scoliosis. Lower thoracic spondylosis. Review of the MIP images confirms the above findings. CT ABDOMEN and PELVIS FINDINGS Hepatobiliary: Sharply defined fluid density 1.2 by 1.0 cm left hepatic lobe lesion on image 22 series 11 compatible with cyst. No further imaging workup of this lesion is indicated. 0.4 cm hypodense lesion inferiorly in the right hepatic lobe on image 41 series 11 is sharply defined and probably also a cyst, although technically too small to characterize. No further imaging workup of this lesion is indicated. Gallbladder unremarkable.  No biliary dilatation. Pancreas: Unremarkable Spleen: Unremarkable Adrenals/Urinary Tract: Bilateral Bosniak category 1 cysts of both kidneys. No further imaging workup of these lesions is indicated. Right kidney lower pole 1.1 by 0.7 by 0.8 cm  Bosniak category 2 cyst, internal density 28 Hounsfield units, calcifications along the margin. No further imaging workup of this lesion is indicated. Both adrenal glands appear normal. Stomach/Bowel: Mild sigmoid colon diverticulosis. Vascular/Lymphatic: Upper normal size right gastric node 0.9 cm in short axis, image 27 series 11. Moderate aortic and branch vessel atheromatous vascular calcifications. Reproductive: Prostatomegaly, prostate gland measures 6.2 by 4.4 by 5.0 cm (volume = 71 cm^3). Other: No supplemental non-categorized findings. Musculoskeletal: Lower lumbar spondylosis and degenerative disc disease resulting in bilateral foraminal impingement at L3-4 and L4-5, and right foraminal impingement at L5-S1. Review of the MIP images confirms the above findings. IMPRESSION: 1. Acute bilateral lower lobe pulmonary emboli. Clot burden moderate. Positive for acute PE with CT evidence of right heart strain (RV/LV  Ratio = 1.17 ) consistent with at least submassive (intermediate risk) PE. The presence of right heart strain has been associated with an increased risk of morbidity and mortality. Please refer to the "PE Focused" order set in EPIC. 2. Wedge shaped consolidation in the posterior basal segment right lower lobe, probably a pulmonary infarct given the morphology and location, but possibly from pulmonary hemorrhage. 3. 7 mm right lower lobe subpleural nodule. Non-contrast chest CT at 6-12 months is recommended. If the nodule is stable at time of repeat CT, then future CT at 18-24 months (from today's scan) is considered optional for low-risk patients, but is recommended for high-risk patients. This recommendation follows the consensus statement: Guidelines for Management of Incidental Pulmonary Nodules Detected on CT Images: From the Fleischner Society 2017; Radiology 2017; 284:228-243. 4. Aortic Atherosclerosis (ICD10-I70.0). Radiology assistant personnel have been notified to put me in telephone contact with the referring physician or the referring physician's clinical representative in order to discuss these findings. Once this communication is established I will issue an addendum to this report for documentation purposes. Electronically Signed: By: Van Clines M.D. On: 09/17/2022 11:27    Assessment/Plan Principal Problem:   Acute pulmonary embolism [facility]) -this is his second PE, the first was technically provoked.  Slightly elevated RV ratio, but the patient is hemodynamically stable without tachycardia, hypoxia, or discomfort. -Observation admission -Continue IV heparin drip -Pain control as needed -Asked with patient and family at the bedside that he needs outpatient hematology follow-up for further evaluation of the etiology of his clotting, and to make sure he completes the appropriate cancer screening -Given elevated RV ratio, though the patient is hemodynamically stable, will obtain  echo Active Problems:   HTN (hypertension) -he is hemodynamically stable, will hold home antihypertensive medication for now   History of DVT (deep vein thrombosis)  DVT prophylaxis: Heparin drip  Code Status: Full code  Family Communication: Discussed with patient and his wife at the bedside at the time of admission.  All questions were answered to their satisfaction.  Disposition Plan: Anticipate the patient will be able to discharge home in the next 24 to 48 hours if remains stable.  Consults called: None  Admission status: Observation  Time spent: 42 minutes  Arminta Gamm Neva Seat MD [facility] Pager [phone]  If 7PM-7AM, please contact night-coverage [email or web address] Password [email or web address]  09/17/2022, 2:54 PM  Hypertension

## 2022-09-17 NOTE — Progress Notes (Signed)
ANTICOAGULATION CONSULT NOTE - Initial Consult  Pharmacy Consult for IV heparin Indication: pulmonary embolus  No Known Allergies  Patient Measurements: Height: 5' 11"$  (180.3 cm) Weight: 77.1 kg (170 lb) IBW/kg (Calculated) : 75.3 Heparin Dosing Weight: 77 kg  Vital Signs: Temp: 98.7 F (37.1 C) (02/19 1223) Temp Source: Oral (02/19 1223) BP: 164/95 (02/19 1223) Pulse Rate: 71 (02/19 1223)  Labs: No results for input(s): "HGB", "HCT", "PLT", "APTT", "LABPROT", "INR", "HEPARINUNFRC", "HEPRLOWMOCWT", "CREATININE", "CKTOTAL", "CKMB", "TROPONINIHS" in the last 72 hours.  CrCl cannot be calculated (Patient's most recent lab result is older than the maximum 21 days allowed.).   Medical History: Past Medical History:  Diagnosis Date   Anginal pain ([place])    CAD (coronary artery disease)    minimal plaque by LHC 11/13:LHC 06/27/12:  dLM 20%, proximal and mid LAD 20%, EF 55% with normal wall motion     Headache(784.0)    Heart murmur    Hx of echocardiogram    Echo 06/27/12: Mild LVH, EF 65-70%.   Hypertension    per pt dx 20 years but has not had any meds in 20 yrs per md   NSTEMI (non-ST elevated myocardial infarction) ([place])    05/2012:  Type 2 in setting of SVT (HR 190s)   SVT (supraventricular tachycardia)     Medications:  Scheduled:  Infusions:   Assessment: 62 yo who has been fatigued, dizzy, short of breath for last 6 weeks and per CT discovered a new PE. Not on anticoagulant prior to admission. Baseline labs ordered but have not been drawn yet.   Goal of Therapy:  Heparin level 0.3-0.7 units/ml Monitor platelets by anticoagulation protocol: Yes   Plan:  After labs drawn, start IV heparin 2500 units bolus x 1 Then start IV heparin at rate of 1400 units/hr Check heparin level 6 hours after start of IV heparin Daily CBC and heparin level  Kara Mead 09/17/2022,12:51 PM

## 2022-09-18 ENCOUNTER — Observation Stay (HOSPITAL_COMMUNITY): Payer: Federal, State, Local not specified - PPO

## 2022-09-18 ENCOUNTER — Telehealth (HOSPITAL_COMMUNITY): Payer: Self-pay

## 2022-09-18 ENCOUNTER — Observation Stay (HOSPITAL_BASED_OUTPATIENT_CLINIC_OR_DEPARTMENT_OTHER): Payer: Federal, State, Local not specified - PPO

## 2022-09-18 ENCOUNTER — Other Ambulatory Visit (HOSPITAL_COMMUNITY): Payer: Self-pay

## 2022-09-18 DIAGNOSIS — I2699 Other pulmonary embolism without acute cor pulmonale: Secondary | ICD-10-CM | POA: Diagnosis not present

## 2022-09-18 DIAGNOSIS — R609 Edema, unspecified: Secondary | ICD-10-CM

## 2022-09-18 DIAGNOSIS — D649 Anemia, unspecified: Secondary | ICD-10-CM | POA: Diagnosis not present

## 2022-09-18 DIAGNOSIS — I82461 Acute embolism and thrombosis of right calf muscular vein: Secondary | ICD-10-CM | POA: Insufficient documentation

## 2022-09-18 LAB — CBC
HCT: 39.2 % (ref 39.0–52.0)
Hemoglobin: 12.8 g/dL — ABNORMAL LOW (ref 13.0–17.0)
MCH: 30.5 pg (ref 26.0–34.0)
MCHC: 32.7 g/dL (ref 30.0–36.0)
MCV: 93.3 fL (ref 80.0–100.0)
Platelets: 280 10*3/uL (ref 150–400)
RBC: 4.2 MIL/uL — ABNORMAL LOW (ref 4.22–5.81)
RDW: 13.2 % (ref 11.5–15.5)
WBC: 6.1 10*3/uL (ref 4.0–10.5)
nRBC: 0 % (ref 0.0–0.2)

## 2022-09-18 LAB — BASIC METABOLIC PANEL
Anion gap: 6 (ref 5–15)
BUN: 12 mg/dL (ref 6–20)
CO2: 26 mmol/L (ref 22–32)
Calcium: 8.6 mg/dL — ABNORMAL LOW (ref 8.9–10.3)
Chloride: 103 mmol/L (ref 98–111)
Creatinine, Ser: 1.02 mg/dL (ref 0.61–1.24)
GFR, Estimated: >60 mL/min (ref >60)
Glucose, Bld: 105 mg/dL — ABNORMAL HIGH (ref 70–99)
Potassium: 4 mmol/L (ref 3.5–5.1)
Sodium: 135 mmol/L (ref 135–145)

## 2022-09-18 LAB — HIV ANTIBODY (ROUTINE TESTING W REFLEX): HIV Screen 4th Generation wRfx: NONREACTIVE

## 2022-09-18 LAB — HEPARIN LEVEL (UNFRACTIONATED): Heparin Unfractionated: 0.75 IU/mL — ABNORMAL HIGH (ref 0.30–0.70)

## 2022-09-18 MED ORDER — APIXABAN 5 MG PO TABS: 5.0000 mg | ORAL_TABLET | Freq: Two times a day (BID) | ORAL | Status: DC

## 2022-09-18 MED ORDER — APIXABAN 5 MG PO TABS
10.0000 mg | ORAL_TABLET | Freq: Two times a day (BID) | ORAL | Status: DC
  Administered 2022-09-18: 10 mg via ORAL
  Filled 2022-09-18: qty 2

## 2022-09-18 MED ORDER — APIXABAN (ELIQUIS) VTE STARTER PACK (10MG AND 5MG)
ORAL_TABLET | ORAL | 0 refills | Status: DC
  Filled 2022-09-18: qty 74, 30d supply, fill #0

## 2022-09-18 NOTE — Plan of Care (Signed)

## 2022-09-18 NOTE — TOC Benefit Eligibility Note (Signed)
Patient Teacher, English as a foreign language completed.    The patient is currently admitted and upon discharge could be taking Eliquis.  The current 30 day co-pay is $60.00.   The patient is insured through [employer]

## 2022-09-18 NOTE — Progress Notes (Signed)
  Transition of Care [facility]) Screening Note   Patient Details  Name: Julian White Date of Birth: 08-31-1960   Transition of Care [facility]) CM/SW Contact:    Vassie Moselle, LCSW Phone Number: 09/18/2022, 9:51 AM    Transition of Care Department [facility]) has reviewed patient and no TOC needs have been identified at this time. We will continue to monitor patient advancement through interdisciplinary progression rounds. If new patient transition needs arise, please place a TOC consult.

## 2022-09-18 NOTE — Progress Notes (Signed)
2D echo was attempted around 1030am. Patient was discharged and not in room. Record was locked by RN since 959am.

## 2022-09-18 NOTE — Progress Notes (Addendum)
ANTICOAGULATION CONSULT NOTE - Follow Up Consult  Pharmacy Consult for Apixaban Indication: pulmonary embolus and DVT  No Known Allergies  Patient Measurements: Height: 5' 11"$  (180.3 cm) Weight: 77.1 kg (170 lb) IBW/kg (Calculated) : 75.3 Heparin Dosing Weight: TBW  Vital Signs: Temp: 98.8 F (37.1 C) (02/19 2037) Temp Source: Oral (02/19 2037) BP: 121/86 (02/19 2037) Pulse Rate: 74 (02/19 2037)  Labs: Recent Labs    09/17/22 1241 09/17/22 1632 09/17/22 2134 09/18/22 0209  HGB 12.9*  --   --   --   HCT 39.7  --   --   --   PLT 251  --   --   --   HEPARINUNFRC  --   --  0.59 0.75*  CREATININE 1.04  --   --   --   TROPONINIHS <2 2  --   --     Estimated Creatinine Clearance: 81.5 mL/min (by C-G formula based on SCr of 1.04 mg/dL).   Medications:  Infusions:   heparin 1,300 Units/hr (09/18/22 0526)    Assessment: 85 yoM admitted on 2/19 with  fatigued, dizzy, short of breath for last 6 weeks and per CT discovered a new PE. Not on anticoagulant prior to admission. LE doppler positive for acute DVT.   Today, 09/18/2022: Most recent heparin level was slightly supratherapeutic at 0.75 and heparin infusion rate was reduced.   CBC: Hgb 12.9, Plt WNL No s/s bleeding or complications reported.     Goal of Therapy:  Heparin level 0.3-0.7 units/ml Monitor platelets by anticoagulation protocol: Yes   Plan:  Stop heparin at 10:00, give first dose of apixaban at that time. Apixaban 15m PO BID x7 days, then 5110mPO BID  Pharmacy to provide coupon, education prior to discharge.     ChGretta ArabharmD, BCPS [facility] main pharmacy [phone]/20/2024 7:17 AM

## 2022-09-18 NOTE — Progress Notes (Signed)
Bilateral lower extremity venous duplex has been completed. Preliminary results can be found in CV Proc through chart review.  Results were given to Dr. Doristine Bosworth.  09/18/22 9:44 AM Julian White RVT

## 2022-09-18 NOTE — Telephone Encounter (Signed)
Pharmacy Patient Advocate Encounter  Insurance verification completed.    The patient is insured through [employer]   The patient is currently admitted and ran test claims for the following: Eliquis.  Copays and coinsurance results were relayed to Inpatient clinical team.

## 2022-09-18 NOTE — Discharge Summary (Signed)
Physician Discharge Summary  Julian White [facility] [id number] DOB: 04-Dec-1960 DOA: 09/17/2022  PCP: Orpah Melter, MD  Admit date: 09/17/2022 Discharge date: 09/18/2022    Admitted From: Home Disposition: Home  Recommendations for Outpatient Follow-up:  Follow up with PCP in 1-2 weeks Please obtain BMP/CBC in one week Please follow up with your PCP on the following pending results: Unresulted Labs (From admission, onward)     Start     Ordered   09/18/22 0500  CBC  Daily,   R      09/17/22 1254   09/18/22 [id number]  Basic metabolic panel  Tomorrow morning,   R        09/17/22 1454   09/18/22 0500  HIV Antibody (routine testing w rflx)  Once,   R        09/18/22 0500              Home Health: None Equipment/Devices: None  Discharge Condition: Stable CODE STATUS: Full code Diet recommendation: Cardiac  HPI: Julian White is a 62 y.o. male with medical history significant for hypertension, acute PE in 2021 after surgery for a broken left arm who is being admitted to the hospital with newly diagnosed acute bilateral submassive PE discovered on outpatient CT scan today, after 6 months of lethargy, weight loss, shortness of breath.  History is provided by the patient, who states he was feeling well until about 6 weeks ago, when he started feeling lethargy, having difficulty feeling like he could take a deep breath.  Denies any frank chest pain, but for the last week or so, he has also had some discomfort behind the left calf.  Denies any fevers, chills, coughing up blood, or pleuritic chest pain.  Couple of years ago when he was diagnosed with acute PE after surgery on his left arm, he took Eliquis for 6 months without complication.   ED Course: After CT showed bilateral acute submassive PE, he was told to come to the ER for evaluation.  Here, vital signs are normal, saturating 100% on room air.  Lab work is unremarkable.  Given bilateral nature of his clot, as well as slightly elevated  RV ratio, patient is being admitted for observation.  He has already been started on a heparin drip.  Subjective: Seen and examined at he has no complaints.  Brief/Interim Summary: Patient was admitted due to PE, he was started on heparin drip.  Doppler lower extremity showed right lower extremity DVT.  Patient is asymptomatic and hemodynamically stable, he was transitioned to Eliquis today.  He is stable so he is going to be discharged on Eliquis.  I have discussed in length with the patient about potential risk and benefits of the anticoagulation which includes but not limited to spontaneous bleeding and death.  Patient verbalized understanding and he is already aware of those risks since he has used Eliquis in the past when he was diagnosed with PE.  Discharge plan was discussed with patient and/or family member and they verbalized understanding and agreed with it.  Discharge Diagnoses:  Principal Problem:   Acute pulmonary embolism (HCC) Active Problems:   HTN (hypertension)   History of DVT (deep vein thrombosis)   Acute deep vein thrombosis (DVT) of calf muscle vein of right lower extremity ([place])    Discharge Instructions   Allergies as of 09/18/2022   No Known Allergies      Medication List     STOP taking these medications    amLODipine-benazepril 5-10  MG capsule Commonly known as: LOTREL   aspirin 81 MG chewable tablet   oxyCODONE-acetaminophen 5-325 MG tablet Commonly known as: PERCOCET/ROXICET   simvastatin 20 MG tablet Commonly known as: ZOCOR       TAKE these medications    Apixaban Starter Pack (51m and 576m Commonly known as: ELIQUIS STARTER PACK Take as directed on package: start with two-29m42mablets twice daily for 7 days. On day 8, switch to one-29mg59mblet twice daily.   rosuvastatin 5 MG tablet Commonly known as: CRESTOR Take 5 mg by mouth daily.        Follow-up Information     MeyeOrpah Melter Follow up in 1 week(s).   Specialty:  Family Medicine Contact information: [phone]            No Known Allergies  Consultations: None   Procedures/Studies: DG Chest 1 View  Result Date: 09/17/2022 CLINICAL DATA:  Shortness of breath. EXAM: CHEST  1 VIEW COMPARISON:  Chest radiographs 06/26/2012.  Chest CTA 09/17/2012. FINDINGS: The cardiomediastinal silhouette is unchanged with normal heart size. The small area of consolidation in the basilar right lower lobe on today's CTA is not well seen radiographically. The lungs are clear elsewhere. No sizable pleural effusion or pneumothorax is identified. No acute osseous abnormality is seen. IMPRESSION: Known basilar right lower lobe consolidation/suspected infarct on CT is not well shown by this radiograph. Lungs are otherwise clear. Electronically Signed   By: AlleLogan Bores.   On: 09/17/2022 13:09   CT Angio Chest Pulmonary Embolism (PE) W or WO Contrast  Addendum Date: 09/17/2022   ADDENDUM REPORT: 09/17/2022 11:38 ADDENDUM: Critical Value/emergent results were called by telephone at the time of interpretation on 09/17/2022 at 11:31 am to provider BRENT BREEDLOVE , who verbally acknowledged these results. Electronically Signed   By: WaltVan Clines.   On: 09/17/2022 11:38   Result Date: 09/17/2022 CLINICAL DATA:  History of blood clots. Loss of appetite and weight loss. EXAM: CT ANGIOGRAPHY CHEST CT ABDOMEN AND PELVIS WITH CONTRAST TECHNIQUE: Multidetector CT imaging of the chest was performed using the standard protocol during bolus administration of intravenous contrast. Multiplanar CT image reconstructions and MIPs were obtained to evaluate the vascular anatomy. Multidetector CT imaging of the abdomen and pelvis was performed using the standard protocol during bolus administration of intravenous contrast. RADIATION DOSE REDUCTION: This exam was performed according to the departmental dose-optimization program which  includes automated exposure control, adjustment of the mA and/or kV according to patient size and/or use of iterative reconstruction technique. CONTRAST:  100mL3mIPAQUE IOHEXOL 350 MG/ML SOLN COMPARISON:  CT abdomen 07/21/2009 FINDINGS: CTA CHEST FINDINGS Cardiovascular: Filling defects compatible with acute pulmonary embolus observed in the right lower lobe and left lower lobe at lobar and segmental levels. Clot burden moderate. Right ventricular to left ventricular ratio elevated at 1.17, indicating right heart strain. Mild atheromatous calcification of the aortic arch and left anterior descending coronary artery. Mediastinum/Nodes: Unremarkable Lungs/Pleura: Wedge shaped consolidation in the posterior basal segment right lower lobe peripherally at 2.7 by 1.4 cm on image 59 series 5, probably a pulmonary infarct given the morphology and location, but possibly from pulmonary hemorrhage. Based on morphology this is substantially less likely to be a pulmonary malignancy. Right lower lobe subpleural nodule measures 7 by 6 by 6 mm (volume = 100 mm^3) on image 42 series 5. Musculoskeletal: Mild levoconvex upper thoracic scoliosis. Lower thoracic spondylosis.  Review of the MIP images confirms the above findings. CT ABDOMEN and PELVIS FINDINGS Hepatobiliary: Sharply defined fluid density 1.2 by 1.0 cm left hepatic lobe lesion on image 22 series 11 compatible with cyst. No further imaging workup of this lesion is indicated. 0.4 cm hypodense lesion inferiorly in the right hepatic lobe on image 41 series 11 is sharply defined and probably also a cyst, although technically too small to characterize. No further imaging workup of this lesion is indicated. Gallbladder unremarkable.  No biliary dilatation. Pancreas: Unremarkable Spleen: Unremarkable Adrenals/Urinary Tract: Bilateral Bosniak category 1 cysts of both kidneys. No further imaging workup of these lesions is indicated. Right kidney lower pole 1.1 by 0.7 by 0.8 cm  Bosniak category 2 cyst, internal density 28 Hounsfield units, calcifications along the margin. No further imaging workup of this lesion is indicated. Both adrenal glands appear normal. Stomach/Bowel: Mild sigmoid colon diverticulosis. Vascular/Lymphatic: Upper normal size right gastric node 0.9 cm in short axis, image 27 series 11. Moderate aortic and branch vessel atheromatous vascular calcifications. Reproductive: Prostatomegaly, prostate gland measures 6.2 by 4.4 by 5.0 cm (volume = 71 cm^3). Other: No supplemental non-categorized findings. Musculoskeletal: Lower lumbar spondylosis and degenerative disc disease resulting in bilateral foraminal impingement at L3-4 and L4-5, and right foraminal impingement at L5-S1. Review of the MIP images confirms the above findings. IMPRESSION: 1. Acute bilateral lower lobe pulmonary emboli. Clot burden moderate. Positive for acute PE with CT evidence of right heart strain (RV/LV Ratio = 1.17 ) consistent with at least submassive (intermediate risk) PE. The presence of right heart strain has been associated with an increased risk of morbidity and mortality. Please refer to the "PE Focused" order set in EPIC. 2. Wedge shaped consolidation in the posterior basal segment right lower lobe, probably a pulmonary infarct given the morphology and location, but possibly from pulmonary hemorrhage. 3. 7 mm right lower lobe subpleural nodule. Non-contrast chest CT at 6-12 months is recommended. If the nodule is stable at time of repeat CT, then future CT at 18-24 months (from today's scan) is considered optional for low-risk patients, but is recommended for high-risk patients. This recommendation follows the consensus statement: Guidelines for Management of Incidental Pulmonary Nodules Detected on CT Images: From the Fleischner Society 2017; Radiology 2017; 284:228-243. 4. Aortic Atherosclerosis (ICD10-I70.0). Radiology assistant personnel have been notified to put me in telephone contact  with the referring physician or the referring physician's clinical representative in order to discuss these findings. Once this communication is established I will issue an addendum to this report for documentation purposes. Electronically Signed: By: Van Clines M.D. On: 09/17/2022 11:27   CT ABDOMEN PELVIS W CONTRAST  Addendum Date: 09/17/2022   ADDENDUM REPORT: 09/17/2022 11:38 ADDENDUM: Critical Value/emergent results were called by telephone at the time of interpretation on 09/17/2022 at 11:31 am to provider BRENT BREEDLOVE , who verbally acknowledged these results. Electronically Signed   By: Van Clines M.D.   On: 09/17/2022 11:38   Result Date: 09/17/2022 CLINICAL DATA:  History of blood clots. Loss of appetite and weight loss. EXAM: CT ANGIOGRAPHY CHEST CT ABDOMEN AND PELVIS WITH CONTRAST TECHNIQUE: Multidetector CT imaging of the chest was performed using the standard protocol during bolus administration of intravenous contrast. Multiplanar CT image reconstructions and MIPs were obtained to evaluate the vascular anatomy. Multidetector CT imaging of the abdomen and pelvis was performed using the standard protocol during bolus administration of intravenous contrast. RADIATION DOSE REDUCTION: This exam was performed according to the departmental dose-optimization  program which includes automated exposure control, adjustment of the mA and/or kV according to patient size and/or use of iterative reconstruction technique. CONTRAST:  142m OMNIPAQUE IOHEXOL 350 MG/ML SOLN COMPARISON:  CT abdomen 07/21/2009 FINDINGS: CTA CHEST FINDINGS Cardiovascular: Filling defects compatible with acute pulmonary embolus observed in the right lower lobe and left lower lobe at lobar and segmental levels. Clot burden moderate. Right ventricular to left ventricular ratio elevated at 1.17, indicating right heart strain. Mild atheromatous calcification of the aortic arch and left anterior descending coronary artery.  Mediastinum/Nodes: Unremarkable Lungs/Pleura: Wedge shaped consolidation in the posterior basal segment right lower lobe peripherally at 2.7 by 1.4 cm on image 59 series 5, probably a pulmonary infarct given the morphology and location, but possibly from pulmonary hemorrhage. Based on morphology this is substantially less likely to be a pulmonary malignancy. Right lower lobe subpleural nodule measures 7 by 6 by 6 mm (volume = 100 mm^3) on image 42 series 5. Musculoskeletal: Mild levoconvex upper thoracic scoliosis. Lower thoracic spondylosis. Review of the MIP images confirms the above findings. CT ABDOMEN and PELVIS FINDINGS Hepatobiliary: Sharply defined fluid density 1.2 by 1.0 cm left hepatic lobe lesion on image 22 series 11 compatible with cyst. No further imaging workup of this lesion is indicated. 0.4 cm hypodense lesion inferiorly in the right hepatic lobe on image 41 series 11 is sharply defined and probably also a cyst, although technically too small to characterize. No further imaging workup of this lesion is indicated. Gallbladder unremarkable.  No biliary dilatation. Pancreas: Unremarkable Spleen: Unremarkable Adrenals/Urinary Tract: Bilateral Bosniak category 1 cysts of both kidneys. No further imaging workup of these lesions is indicated. Right kidney lower pole 1.1 by 0.7 by 0.8 cm Bosniak category 2 cyst, internal density 28 Hounsfield units, calcifications along the margin. No further imaging workup of this lesion is indicated. Both adrenal glands appear normal. Stomach/Bowel: Mild sigmoid colon diverticulosis. Vascular/Lymphatic: Upper normal size right gastric node 0.9 cm in short axis, image 27 series 11. Moderate aortic and branch vessel atheromatous vascular calcifications. Reproductive: Prostatomegaly, prostate gland measures 6.2 by 4.4 by 5.0 cm (volume = 71 cm^3). Other: No supplemental non-categorized findings. Musculoskeletal: Lower lumbar spondylosis and degenerative disc disease  resulting in bilateral foraminal impingement at L3-4 and L4-5, and right foraminal impingement at L5-S1. Review of the MIP images confirms the above findings. IMPRESSION: 1. Acute bilateral lower lobe pulmonary emboli. Clot burden moderate. Positive for acute PE with CT evidence of right heart strain (RV/LV Ratio = 1.17 ) consistent with at least submassive (intermediate risk) PE. The presence of right heart strain has been associated with an increased risk of morbidity and mortality. Please refer to the "PE Focused" order set in EPIC. 2. Wedge shaped consolidation in the posterior basal segment right lower lobe, probably a pulmonary infarct given the morphology and location, but possibly from pulmonary hemorrhage. 3. 7 mm right lower lobe subpleural nodule. Non-contrast chest CT at 6-12 months is recommended. If the nodule is stable at time of repeat CT, then future CT at 18-24 months (from today's scan) is considered optional for low-risk patients, but is recommended for high-risk patients. This recommendation follows the consensus statement: Guidelines for Management of Incidental Pulmonary Nodules Detected on CT Images: From the Fleischner Society 2017; Radiology 2017; 284:228-243. 4. Aortic Atherosclerosis (ICD10-I70.0). Radiology assistant personnel have been notified to put me in telephone contact with the referring physician or the referring physician's clinical representative in order to discuss these findings. Once this communication is  established I will issue an addendum to this report for documentation purposes. Electronically Signed: By: Van Clines M.D. On: 09/17/2022 11:27     Discharge Exam: Vitals:   09/17/22 1557 09/17/22 2037  BP: 134/86 121/86  Pulse: 63 74  Resp: 17 15  Temp: 98.8 F (37.1 C) 98.8 F (37.1 C)  SpO2: 98% 98%   Vitals:   09/17/22 1330 09/17/22 1400 09/17/22 1557 09/17/22 2037  BP: (!) 131/91 (!) 129/94 134/86 121/86  Pulse: 75 72 63 74  Resp: 14 17 17 15   $ Temp:   98.8 F (37.1 C) 98.8 F (37.1 C)  TempSrc:   Oral Oral  SpO2: 99% 97% 98% 98%  Weight:      Height:        General: Pt is alert, awake, not in acute distress Cardiovascular: RRR, S1/S2 +, no rubs, no gallops Respiratory: CTA bilaterally, no wheezing, no rhonchi Abdominal: Soft, NT, ND, bowel sounds + Extremities: no edema, no cyanosis    The results of significant diagnostics from this hospitalization (including imaging, microbiology, ancillary and laboratory) are listed below for reference.     Microbiology: No results found for this or any previous visit (from the past 240 hour(s)).   Labs: BNP (last 3 results) No results for input(s): "BNP" in the last 8760 hours. Basic Metabolic Panel: Recent Labs  Lab 09/17/22 1241  NA 140  K 4.0  CL 106  CO2 26  GLUCOSE 105*  BUN 12  CREATININE 1.04  CALCIUM 8.9   Liver Function Tests: Recent Labs  Lab 09/17/22 1241  AST 23  ALT 18  ALKPHOS 100  BILITOT 0.9  PROT 7.7  ALBUMIN 4.0   No results for input(s): "LIPASE", "AMYLASE" in the last 168 hours. No results for input(s): "AMMONIA" in the last 168 hours. CBC: Recent Labs  Lab 09/17/22 1241  WBC 6.9  NEUTROABS 3.6  HGB 12.9*  HCT 39.7  MCV 93.4  PLT 251   Cardiac Enzymes: No results for input(s): "CKTOTAL", "CKMB", "CKMBINDEX", "TROPONINI" in the last 168 hours. BNP: Invalid input(s): "POCBNP" CBG: No results for input(s): "GLUCAP" in the last 168 hours. D-Dimer No results for input(s): "DDIMER" in the last 72 hours. Hgb A1c No results for input(s): "HGBA1C" in the last 72 hours. Lipid Profile No results for input(s): "CHOL", "HDL", "LDLCALC", "TRIG", "CHOLHDL", "LDLDIRECT" in the last 72 hours. Thyroid function studies No results for input(s): "TSH", "T4TOTAL", "T3FREE", "THYROIDAB" in the last 72 hours.  Invalid input(s): "FREET3" Anemia work up No results for input(s): "VITAMINB12", "FOLATE", "FERRITIN", "TIBC", "IRON", "RETICCTPCT"  in the last 72 hours. Urinalysis    Component Value Date/Time   COLORURINE YELLOW 07/21/2009 1822   APPEARANCEUR CLEAR 07/21/2009 1822   LABSPEC 1.025 07/21/2009 1822   PHURINE 5.5 07/21/2009 1822   GLUCOSEU NEGATIVE 07/21/2009 1822   HGBUR MODERATE (A) 07/21/2009 1822   BILIRUBINUR NEGATIVE 07/21/2009 1822   KETONESUR 40 (A) 07/21/2009 1822   PROTEINUR NEGATIVE 07/21/2009 1822   UROBILINOGEN 0.2 07/21/2009 1822   NITRITE NEGATIVE 07/21/2009 1822   LEUKOCYTESUR NEGATIVE 07/21/2009 1822   Sepsis Labs Recent Labs  Lab 09/17/22 1241  WBC 6.9   Microbiology No results found for this or any previous visit (from the past 240 hour(s)).   Time coordinating discharge: Over 30 minutes  SIGNED:   Darliss Cheney, MD  [facility] 09/18/2022, 9:18 AM *Please note that this is a verbal dictation therefore any spelling or grammatical errors are due to the "Dragon  [employer] One" system interpretation. If 7PM-7AM, please contact night-coverage [email or web address]

## 2022-09-18 NOTE — Discharge Instructions (Signed)
Information on my medicine - ELIQUIS (apixaban) Why was Eliquis prescribed for you? Eliquis was prescribed to treat blood clots that may have been found in the veins of your legs (deep vein thrombosis) or in your lungs (pulmonary embolism) and to reduce the risk of them occurring again.  What do You need to know about Eliquis ? The starting dose is 10 mg (two 5 mg tablets) taken TWICE daily for the FIRST SEVEN (7) DAYS, then on 09/25/2022  the dose is reduced to ONE 5 mg tablet taken TWICE daily.  Eliquis may be taken with or without food.   Try to take the dose about the same time in the morning and in the evening. If you have difficulty swallowing the tablet whole please discuss with your pharmacist how to take the medication safely.  Take Eliquis exactly as prescribed and DO NOT stop taking Eliquis without talking to the doctor who prescribed the medication.  Stopping may increase your risk of developing a new blood clot.  Refill your prescription before you run out.  After discharge, you should have regular check-up appointments with your healthcare provider that is prescribing your Eliquis.    What do you do if you miss a dose? If a dose of ELIQUIS is not taken at the scheduled time, take it as soon as possible on the same day and twice-daily administration should be resumed. The dose should not be doubled to make up for a missed dose.  Important Safety Information A possible side effect of Eliquis is bleeding. You should call your healthcare provider right away if you experience any of the following: Bleeding from an injury or your nose that does not stop. Unusual colored urine (red or dark brown) or unusual colored stools (red or black). Unusual bruising for unknown reasons. A serious fall or if you hit your head (even if there is no bleeding).  Some medicines may interact with Eliquis and might increase your risk of bleeding or clotting while on Eliquis. To help avoid this,  consult your healthcare provider or pharmacist prior to using any new prescription or non-prescription medications, including herbals, vitamins, non-steroidal anti-inflammatory drugs (NSAIDs) and supplements.  This website has more information on Eliquis (apixaban): http://[email or web address].com/eliquis/home

## 2022-09-18 NOTE — Progress Notes (Signed)
ANTICOAGULATION CONSULT NOTE - follow up  Pharmacy Consult for IV heparin Indication: pulmonary embolus  No Known Allergies  Patient Measurements: Height: 5' 11"$  (180.3 cm) Weight: 77.1 kg (170 lb) IBW/kg (Calculated) : 75.3 Heparin Dosing Weight: 77 kg  Vital Signs: Temp: 98.8 F (37.1 C) (02/19 2037) Temp Source: Oral (02/19 2037) BP: 121/86 (02/19 2037) Pulse Rate: 74 (02/19 2037)  Labs: Recent Labs    09/17/22 1241 09/17/22 1632 09/17/22 2134 09/18/22 0209  HGB 12.9*  --   --   --   HCT 39.7  --   --   --   PLT 251  --   --   --   HEPARINUNFRC  --   --  0.59 0.75*  CREATININE 1.04  --   --   --   TROPONINIHS <2 2  --   --      Estimated Creatinine Clearance: 81.5 mL/min (by C-G formula based on SCr of 1.04 mg/dL).   Medical History: Past Medical History:  Diagnosis Date   Anginal pain ([place])    CAD (coronary artery disease)    minimal plaque by LHC 11/13:LHC 06/27/12:  dLM 20%, proximal and mid LAD 20%, EF 55% with normal wall motion     Headache(784.0)    Heart murmur    Hx of echocardiogram    Echo 06/27/12: Mild LVH, EF 65-70%.   Hypertension    per pt dx 20 years but has not had any meds in 20 yrs per md   NSTEMI (non-ST elevated myocardial infarction) ([place])    05/2012:  Type 2 in setting of SVT (HR 190s)   SVT (supraventricular tachycardia)     Medications:  Scheduled:  Infusions:   Assessment: 62 yo who has been fatigued, dizzy, short of breath for last 6 weeks and per CT discovered a new PE. Not on anticoagulant prior to admission. Baseline labs ordered but have not been drawn yet.   HL 0.75 supra therapeutic on 1400 units/hr No bleeding per RN  Goal of Therapy:  Heparin level 0.3-0.7 units/ml Monitor platelets by anticoagulation protocol: Yes   Plan:  Decrease heparin drip to 1300 units/hr Heparin  level in 6 hours Daily CBC  Dolly Rias RPh 09/18/2022, 2:40 AM

## 2022-09-21 DIAGNOSIS — I2699 Other pulmonary embolism without acute cor pulmonale: Secondary | ICD-10-CM | POA: Diagnosis not present

## 2022-09-21 DIAGNOSIS — I82401 Acute embolism and thrombosis of unspecified deep veins of right lower extremity: Secondary | ICD-10-CM | POA: Diagnosis not present

## 2022-09-21 DIAGNOSIS — I10 Essential (primary) hypertension: Secondary | ICD-10-CM | POA: Diagnosis not present

## 2022-09-25 ENCOUNTER — Telehealth: Payer: Self-pay | Admitting: Physician Assistant

## 2022-09-25 NOTE — Telephone Encounter (Signed)
Scheduled appt per 2/27 referral. Pt is aware of appt date and time. Pt is aware to arrive 15 mins prior to appt time and to bring and updated insurance card. Pt is aware of appt location.   °

## 2022-10-03 ENCOUNTER — Telehealth (HOSPITAL_BASED_OUTPATIENT_CLINIC_OR_DEPARTMENT_OTHER): Payer: Self-pay | Admitting: *Deleted

## 2022-10-03 ENCOUNTER — Ambulatory Visit (HOSPITAL_BASED_OUTPATIENT_CLINIC_OR_DEPARTMENT_OTHER): Payer: Federal, State, Local not specified - PPO | Admitting: Pulmonary Disease

## 2022-10-03 ENCOUNTER — Institutional Professional Consult (permissible substitution): Payer: Federal, State, Local not specified - PPO | Admitting: Pulmonary Disease

## 2022-10-03 ENCOUNTER — Encounter (HOSPITAL_BASED_OUTPATIENT_CLINIC_OR_DEPARTMENT_OTHER): Payer: Self-pay | Admitting: Pulmonary Disease

## 2022-10-03 VITALS — BP 124/86 | HR 77 | Ht 71.0 in | Wt 172.0 lb

## 2022-10-03 DIAGNOSIS — R911 Solitary pulmonary nodule: Secondary | ICD-10-CM

## 2022-10-03 DIAGNOSIS — Z86711 Personal history of pulmonary embolism: Secondary | ICD-10-CM

## 2022-10-03 NOTE — Telephone Encounter (Signed)
Left message for patient to call and schedule the Echocardiogram ordered by Dr. Halford Chessman

## 2022-10-03 NOTE — Patient Instructions (Signed)
Will schedule Echocardiogram  Follow up in 2 months

## 2022-10-03 NOTE — Progress Notes (Signed)
[facility]  Chief Complaint  Patient presents with   Consult    Blood clot in each lung and leg    Past Surgical History:  He  has a past surgical history that includes Cardiac catheterization; ABLATION; left heart catheterization with coronary angiogram (N/A, 06/27/2012); supraventricular tachycardia ablation (N/A, 07/17/2012); Colonoscopy; and ORIF elbow fracture (Left, 01/13/2020).  Past Medical History:  CAD, Headache, HTN, SVT, Rt leg DVT June 2021 and February 2024, PE June 2021 and February 2024  Constitutional:  BP 124/86 (BP Location: Right Arm, Cuff Size: Normal)   Pulse 77   Ht '5\' 11"'$  (1.803 m)   Wt 172 lb (78 kg)   SpO2 97%   BMI 23.99 kg/m   Brief Summary:  Julian White is a 62 y.o. male with recurrent thromboembolic disease.      Subjective:   He had orthopedic surgery in June 2021.  He developed Rt leg DVT and PE after this.  He was followed by Dr. Dionne Milo with pulmonary.  He was on eliquis for 6 months.  He developed dizziness and shortness of breath in January 2024.  This persisted.  He had CT angio chest in February 2024 which showed b/l PE with RLL infarct, and small RLL nodule.  He was started on heparin infusion and then transitioned to eliquis.  He was discharged from hospital on 09/18/22.  There weren't any apparent provoking factors for this round of DVT/PE.  He still gets dizzy when walking intermittently.  His chest sometimes feels heavy.  He is not having cough, wheeze, sputum, or hemoptysis.  Right leg pain is better.  No issues with his breathing while asleep.  He doesn't feel like his breathing limits his activity level.  His mother had a blood clot in her 63's and his father had a blood clot in his 58's.  Physical Exam:   Appearance - well kempt   ENMT - no sinus tenderness, no oral exudate, no LAN, Mallampati 2 airway, no stridor  Respiratory - equal breath sounds bilaterally, no wheezing  or rales  CV - s1s2 regular rate and rhythm, no murmurs  Ext - no clubbing, no edema  Skin - no rashes  Psych - normal mood and affect   Pulmonary testing:    Chest Imaging:  CT angio chest 01/17/20 >> b/l PE CT angio chest 09/17/22 >> acute PE RLL and LLL, RV:LV 1.17, pulmonary infarct RLL, RLL 8 x 6 x 6 mm nodule  Cardiac Tests:  Echo 03/02/20 >> EF 55 to 60%  Social History:  He  reports that he has never smoked. He has never used smokeless tobacco. He reports that he does not drink alcohol and does not use drugs.  Family History:  His family history includes CAD in his brother and father.    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  There is no obvious provoking factor for this occurrence of thromboembolic disease.    He has incidental finding of Rt lower lobe nodule.  Assessment/Plan:   Recurrent thromboembolic disease. - explained he will likely need to remain on anticoagulation indefinitely, unless he develops significant risk of bleeding - will get Echocardiogram to assess RV function and PA pressures - he has hematology appointment at [facility] later this month  Right lower lobe lung nodule. - will get CT chest without contrast in August 2024; can also resolution of  pulmonary infarct at that time  Time Spent Involved in Patient Care on Day of Examination:  51 minutes  Follow up:   Patient Instructions  Will schedule Echocardiogram  Follow up in 2 months  Medication List:   Allergies as of 10/03/2022   No Known Allergies      Medication List        Accurate as of October 03, 2022  1:28 PM. If you have any questions, ask your nurse or doctor.          Eliquis DVT/PE Starter Pack Generic drug: Apixaban Starter Pack ('10mg'$  and '5mg'$ ) Take as directed on package: start with two-'5mg'$  tablets twice daily for 7 days. On day 8, switch to one-'5mg'$  tablet twice daily.   rosuvastatin 5 MG  tablet Commonly known as: CRESTOR Take 5 mg by mouth daily.        Signature:  Chesley Mires, MD [place] Pager - [phone] 10/03/2022, 1:28 PM

## 2022-10-08 NOTE — Progress Notes (Signed)
[place] [phone]   Fax:([phone]) [place] NOTE  Patient Care Team: Orpah Melter, MD as PCP - General (Family Medicine)  Hematological/Oncological History 12/2019: Acute PE and right lower extremity DVT after orthopedic surgery for a broken left arm. Treated with eliquis for 6 months.  09/17/2022-09/18/2022: Presented with dizziness and shortness of breath. Admitted for CTA chest showed acute bilateral lower lobe pulmonary emboli, clot burden moderate, right lower lobe infarct. Evidence of right heart strain consistent with at least submassive PE. Doppler showed acute DVT involving right popliteal vein. Patient was started on IV heparin and transitioned to Eliquis therapy. 10/09/2022: Establish care with [facility] Hematology  CHIEF COMPLAINTS/PURPOSE OF CONSULTATION:  Recurrent DVT/PE  HISTORY OF PRESENTING ILLNESS:  Julian White 62 y.o. 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 He is unaccompanied for this visit.   On exam today, Julian White reports that since hospital discharge and continuing on Eliquis therapy, his shortness of breath and chest discomfort has improved. He is tolerating eliquis therapy without any toxicities including bruising or bleeding episodes. He does reports some improvement of right lower extremity edema. He denies any neuropathy, skin discoloration or difficulty with ambulation of the right lower extremity. He has no other complaints. He denies fevers, chills, sweats, cough, nausea, vomiting, diarrhea or constipation. Rest of the 10 point ROS is below.   MEDICAL HISTORY:  Past Medical History:  Diagnosis Date   Anginal pain ([place])    CAD (coronary artery disease)    minimal plaque by LHC 11/13:LHC 06/27/12:  dLM 20%, proximal and mid LAD 20%, EF 55% with normal wall motion     Headache(784.0)     Heart murmur    Hx of echocardiogram    Echo 06/27/12: Mild LVH, EF 65-70%.   Hypertension    per pt dx 20 years but has not had any meds in 20 yrs per md   NSTEMI (non-ST elevated myocardial infarction) ([place])    05/2012:  Type 2 in setting of SVT (HR 190s)   SVT (supraventricular tachycardia)     SURGICAL HISTORY: Past Surgical History:  Procedure Laterality Date   ABLATION     svt   CARDIAC CATHETERIZATION     COLONOSCOPY     LEFT HEART CATHETERIZATION WITH CORONARY ANGIOGRAM N/A 06/27/2012   Procedure: LEFT HEART CATHETERIZATION WITH CORONARY ANGIOGRAM;  Surgeon: Jolaine Artist, MD;  Location: [facility] CATH LAB;  Service: Cardiovascular;  Laterality: N/A;   ORIF ELBOW FRACTURE Left 01/13/2020   Procedure: OPEN REDUCTION INTERNAL FIXATION (ORIF) ELBOW ulna and radius FRACTURE;  Surgeon: Iran Planas, MD;  Location: [place];  Service: Orthopedics;  Laterality: Left;  with regional block   SUPRAVENTRICULAR TACHYCARDIA ABLATION N/A 07/17/2012   Procedure: SUPRAVENTRICULAR TACHYCARDIA ABLATION;  Surgeon: Evans Lance, MD;  Location: [facility] CATH LAB;  Service: Cardiovascular;  Laterality: N/A;    SOCIAL HISTORY: Social History   Socioeconomic History   Marital status: Married    Spouse name: Not on file   Number of children: Not on file   Years of education: Not on file   Highest education level: Not on file  Occupational History   Not on file  Tobacco Use   Smoking status: Never   Smokeless tobacco: Never  Substance and Sexual Activity   Alcohol use: No   Drug use: No   Sexual activity: Yes  Other Topics  Concern   Not on file  Social History Narrative   Not on file   Social Determinants of Health   Financial Resource Strain: Not on file  Food Insecurity: No Food Insecurity (10/09/2022)   Hunger Vital Sign    Worried About Running Out of Food in the Last Year: Never true    Ran Out of Food in the Last Year: Never true  Transportation Needs: No Transportation Needs  (10/09/2022)   PRAPARE - Hydrologist (Medical): No    Lack of Transportation (Non-Medical): No  Physical Activity: Not on file  Stress: Not on file  Social Connections: Not on file  Intimate Partner Violence: Not At Risk (10/09/2022)   Humiliation, Afraid, Rape, and Kick questionnaire    Fear of Current or Ex-Partner: No    Emotionally Abused: No    Physically Abused: No    Sexually Abused: No    FAMILY HISTORY: Family History  Problem Relation Age of Onset   Pulmonary embolism Mother    CAD Father        s/p CABG at 67   Deep vein thrombosis Father    CAD Brother        s/p MI in his 27s   Pancreatic cancer Paternal Uncle    Pancreatic cancer Paternal Uncle    Stomach cancer Cousin     ALLERGIES:  has No Known Allergies.  MEDICATIONS:  Current Outpatient Medications  Medication Sig Dispense Refill   acetaminophen (TYLENOL) 500 MG tablet Take 500 mg by mouth every 6 (six) hours as needed for moderate pain.     APIXABAN (ELIQUIS) VTE STARTER PACK ('10MG'$  AND '5MG'$ ) Take as directed on package: start with two-'5mg'$  tablets twice daily for 7 days. On day 8, switch to one-'5mg'$  tablet twice daily. 74 tablet 0   rosuvastatin (CRESTOR) 5 MG tablet Take 5 mg by mouth daily.     amLODipine-benazepril (LOTREL) 5-10 MG capsule Take 1 capsule by mouth daily. (Patient not taking: Reported on 10/09/2022)     No current facility-administered medications for this visit.    REVIEW OF SYSTEMS:   Constitutional: ( - ) fevers, ( - )  chills , ( - ) night sweats Eyes: ( - ) blurriness of vision, ( - ) double vision, ( - ) watery eyes Ears, nose, mouth, throat, and face: ( - ) mucositis, ( - ) sore throat Respiratory: ( - ) cough, ( +) dyspnea, ( - ) wheezes Cardiovascular: ( - ) palpitation, ( - ) chest discomfort, ( + ) lower extremity swelling Gastrointestinal:  ( - ) nausea, ( - ) heartburn, ( - ) change in bowel habits Skin: ( - ) abnormal skin rashes Lymphatics: (  - ) new lymphadenopathy, ( - ) easy bruising Neurological: ( - ) numbness, ( - ) tingling, ( - ) new weaknesses Behavioral/Psych: ( - ) mood change, ( - ) new changes  All other systems were reviewed with the patient and are negative.  PHYSICAL EXAMINATION: ECOG PERFORMANCE STATUS: 1 - Symptomatic but completely ambulatory  Vitals:   10/09/22 0906  BP: (!) 138/90  Pulse: 70  Resp: 13  Temp: (!) 97.3 F (36.3 C)  SpO2: 100%   Filed Weights   10/09/22 0906  Weight: 174 lb 4.8 oz (79.1 kg)    GENERAL: well appearing male in NAD  SKIN: skin color, texture, turgor are normal, no rashes or significant lesions EYES: conjunctiva are pink and non-injected, sclera clear  OROPHARYNX: no exudate, no erythema; lips, buccal mucosa, and tongue normal  NECK: supple, non-tender LYMPH:  no palpable lymphadenopathy in the cervica or supraclavicular lymph nodes.  LUNGS: clear to auscultation and percussion with normal breathing effort HEART: regular rate & rhythm and no murmurs. +Right lower extremity edema that is warm to touch.  ABDOMEN: soft, non-tender, non-distended, normal bowel sounds Musculoskeletal: no cyanosis of digits and no clubbing  PSYCH: alert & oriented x 3, fluent speech NEURO: no focal motor/sensory deficits  LABORATORY DATA:  I have reviewed the data as listed    Latest Ref Rng & Units 10/09/2022   10:24 AM 09/18/2022    9:28 AM 09/17/2022   12:41 PM  CBC  WBC 4.0 - 10.5 K/uL 5.2  6.1  6.9   Hemoglobin 13.0 - 17.0 g/dL 14.7  12.8  12.9   Hematocrit 39.0 - 52.0 % 43.2  39.2  39.7   Platelets 150 - 400 K/uL 208  280  251        Latest Ref Rng & Units 10/09/2022   10:24 AM 09/18/2022    9:28 AM 09/17/2022   12:41 PM  CMP  Glucose 70 - 99 mg/dL 98  105  105   BUN 6 - 20 mg/dL '16  12  12   '$ Creatinine 0.61 - 1.24 mg/dL 1.08  1.02  1.04   Sodium 135 - 145 mmol/L 139  135  140   Potassium 3.5 - 5.1 mmol/L 4.3  4.0  4.0   Chloride 98 - 111 mmol/L 105  103  106   CO2 22 -  32 mmol/L '28  26  26   '$ Calcium 8.9 - 10.3 mg/dL 9.8  8.6  8.9   Total Protein 6.5 - 8.1 g/dL 8.3   7.7   Total Bilirubin 0.3 - 1.2 mg/dL 0.9   0.9   Alkaline Phos 38 - 126 U/L 118   100   AST 15 - 41 U/L 21   23   ALT 0 - 44 U/L 16   18    RADIOGRAPHIC STUDIES: I have personally reviewed the radiological images as listed and agreed with the findings in the report. VAS [place] LOWER EXTREMITY VENOUS (DVT)  Result Date: 09/18/2022  Lower Venous DVT Study Patient Name:  LANNIS HEIMBUCH [facility]  Date of Exam:   09/18/2022 Medical Rec #: [id number]        Accession #:    [id number] Date of Birth: [birth year]        Patient Gender: M Patient Age:   36 years Exam Location:  [facility] Procedure:      VAS [place] LOWER EXTREMITY VENOUS (DVT) Referring Phys: RAVI PAHWANI --------------------------------------------------------------------------------  Indications: Edema, and pulmonary embolism.  Risk Factors: Confirmed PE. Anticoagulation: Heparin. Comparison Study: No prior studies. Performing Technologist: Oliver Hum RVT  Examination Guidelines: A complete evaluation includes B-mode imaging, spectral Doppler, color Doppler, and power Doppler as needed of all accessible portions of each vessel. Bilateral testing is considered an integral part of a complete examination. Limited examinations for reoccurring indications may be performed as noted. The reflux portion of the exam is performed with the patient in reverse Trendelenburg.  +---------+---------------+---------+-----------+----------+--------------+ RIGHT    CompressibilityPhasicitySpontaneityPropertiesThrombus Aging +---------+---------------+---------+-----------+----------+--------------+ CFV      Full           Yes      Yes                                 +---------+---------------+---------+-----------+----------+--------------+  SFJ      Full                                                         +---------+---------------+---------+-----------+----------+--------------+ FV Prox  Full                                                        +---------+---------------+---------+-----------+----------+--------------+ FV Mid   Full                                                        +---------+---------------+---------+-----------+----------+--------------+ FV DistalFull                                                        +---------+---------------+---------+-----------+----------+--------------+ PFV      Full                                                        +---------+---------------+---------+-----------+----------+--------------+ POP      Partial        Yes      Yes                  Acute          +---------+---------------+---------+-----------+----------+--------------+ PTV      Full                                                        +---------+---------------+---------+-----------+----------+--------------+ PERO     Full                                                        +---------+---------------+---------+-----------+----------+--------------+   +---------+---------------+---------+-----------+----------+--------------+ LEFT     CompressibilityPhasicitySpontaneityPropertiesThrombus Aging +---------+---------------+---------+-----------+----------+--------------+ CFV      Full           Yes      Yes                                 +---------+---------------+---------+-----------+----------+--------------+ SFJ      Full                                                        +---------+---------------+---------+-----------+----------+--------------+  FV Prox  Full                                                        +---------+---------------+---------+-----------+----------+--------------+ FV Mid   Full                                                         +---------+---------------+---------+-----------+----------+--------------+ FV DistalFull                                                        +---------+---------------+---------+-----------+----------+--------------+ PFV      Full                                                        +---------+---------------+---------+-----------+----------+--------------+ POP      Full           Yes      Yes                                 +---------+---------------+---------+-----------+----------+--------------+ PTV      Full                                                        +---------+---------------+---------+-----------+----------+--------------+ PERO     Full                                                        +---------+---------------+---------+-----------+----------+--------------+     Summary: RIGHT: - Findings consistent with acute deep vein thrombosis involving the right popliteal vein. - No cystic structure found in the popliteal fossa.  LEFT: - There is no evidence of deep vein thrombosis in the lower extremity.  - No cystic structure found in the popliteal fossa.  *See table(s) above for measurements and observations. Electronically signed by Deitra Mayo MD on 09/18/2022 at 12:05:48 PM.    Final    DG Chest 1 View  Result Date: 09/17/2022 CLINICAL DATA:  Shortness of breath. EXAM: CHEST  1 VIEW COMPARISON:  Chest radiographs 06/26/2012.  Chest CTA 09/17/2012. FINDINGS: The cardiomediastinal silhouette is unchanged with normal heart size. The small area of consolidation in the basilar right lower lobe on today's CTA is not well seen radiographically. The lungs are clear elsewhere. No sizable pleural effusion or pneumothorax is identified. No acute osseous abnormality is seen. IMPRESSION: Known basilar right lower lobe consolidation/suspected infarct on CT is not well shown by this radiograph. Lungs are otherwise  clear. Electronically Signed   By: Logan Bores M.D.   On: 09/17/2022 13:09   CT Angio Chest Pulmonary Embolism (PE) W or WO Contrast  Addendum Date: 09/17/2022   ADDENDUM REPORT: 09/17/2022 11:38 ADDENDUM: Critical Value/emergent results were called by telephone at the time of interpretation on 09/17/2022 at 11:31 am to provider BRENT BREEDLOVE , who verbally acknowledged these results. Electronically Signed   By: Van Clines M.D.   On: 09/17/2022 11:38   Result Date: 09/17/2022 CLINICAL DATA:  History of blood clots. Loss of appetite and weight loss. EXAM: CT ANGIOGRAPHY CHEST CT ABDOMEN AND PELVIS WITH CONTRAST TECHNIQUE: Multidetector CT imaging of the chest was performed using the standard protocol during bolus administration of intravenous contrast. Multiplanar CT image reconstructions and MIPs were obtained to evaluate the vascular anatomy. Multidetector CT imaging of the abdomen and pelvis was performed using the standard protocol during bolus administration of intravenous contrast. RADIATION DOSE REDUCTION: This exam was performed according to the departmental dose-optimization program which includes automated exposure control, adjustment of the mA and/or kV according to patient size and/or use of iterative reconstruction technique. CONTRAST:  140m OMNIPAQUE IOHEXOL 350 MG/ML SOLN COMPARISON:  CT abdomen 07/21/2009 FINDINGS: CTA CHEST FINDINGS Cardiovascular: Filling defects compatible with acute pulmonary embolus observed in the right lower lobe and left lower lobe at lobar and segmental levels. Clot burden moderate. Right ventricular to left ventricular ratio elevated at 1.17, indicating right heart strain. Mild atheromatous calcification of the aortic arch and left anterior descending coronary artery. Mediastinum/Nodes: Unremarkable Lungs/Pleura: Wedge shaped consolidation in the posterior basal segment right lower lobe peripherally at 2.7 by 1.4 cm on image 59 series 5, probably a pulmonary infarct given the morphology and  location, but possibly from pulmonary hemorrhage. Based on morphology this is substantially less likely to be a pulmonary malignancy. Right lower lobe subpleural nodule measures 7 by 6 by 6 mm (volume = 100 mm^3) on image 42 series 5. Musculoskeletal: Mild levoconvex upper thoracic scoliosis. Lower thoracic spondylosis. Review of the MIP images confirms the above findings. CT ABDOMEN and PELVIS FINDINGS Hepatobiliary: Sharply defined fluid density 1.2 by 1.0 cm left hepatic lobe lesion on image 22 series 11 compatible with cyst. No further imaging workup of this lesion is indicated. 0.4 cm hypodense lesion inferiorly in the right hepatic lobe on image 41 series 11 is sharply defined and probably also a cyst, although technically too small to characterize. No further imaging workup of this lesion is indicated. Gallbladder unremarkable.  No biliary dilatation. Pancreas: Unremarkable Spleen: Unremarkable Adrenals/Urinary Tract: Bilateral Bosniak category 1 cysts of both kidneys. No further imaging workup of these lesions is indicated. Right kidney lower pole 1.1 by 0.7 by 0.8 cm Bosniak category 2 cyst, internal density 28 Hounsfield units, calcifications along the margin. No further imaging workup of this lesion is indicated. Both adrenal glands appear normal. Stomach/Bowel: Mild sigmoid colon diverticulosis. Vascular/Lymphatic: Upper normal size right gastric node 0.9 cm in short axis, image 27 series 11. Moderate aortic and branch vessel atheromatous vascular calcifications. Reproductive: Prostatomegaly, prostate gland measures 6.2 by 4.4 by 5.0 cm (volume = 71 cm^3). Other: No supplemental non-categorized findings. Musculoskeletal: Lower lumbar spondylosis and degenerative disc disease resulting in bilateral foraminal impingement at L3-4 and L4-5, and right foraminal impingement at L5-S1. Review of the MIP images confirms the above findings. IMPRESSION: 1. Acute bilateral lower lobe pulmonary emboli. Clot burden  moderate. Positive for acute PE with CT evidence of right heart strain (RV/LV Ratio =  1.17 ) consistent with at least submassive (intermediate risk) PE. The presence of right heart strain has been associated with an increased risk of morbidity and mortality. Please refer to the "PE Focused" order set in EPIC. 2. Wedge shaped consolidation in the posterior basal segment right lower lobe, probably a pulmonary infarct given the morphology and location, but possibly from pulmonary hemorrhage. 3. 7 mm right lower lobe subpleural nodule. Non-contrast chest CT at 6-12 months is recommended. If the nodule is stable at time of repeat CT, then future CT at 18-24 months (from today's scan) is considered optional for low-risk patients, but is recommended for high-risk patients. This recommendation follows the consensus statement: Guidelines for Management of Incidental Pulmonary Nodules Detected on CT Images: From the Fleischner Society 2017; Radiology 2017; 284:228-243. 4. Aortic Atherosclerosis (ICD10-I70.0). Radiology assistant personnel have been notified to put me in telephone contact with the referring physician or the referring physician's clinical representative in order to discuss these findings. Once this communication is established I will issue an addendum to this report for documentation purposes. Electronically Signed: By: Van Clines M.D. On: 09/17/2022 11:27   CT ABDOMEN PELVIS W CONTRAST  Addendum Date: 09/17/2022   ADDENDUM REPORT: 09/17/2022 11:38 ADDENDUM: Critical Value/emergent results were called by telephone at the time of interpretation on 09/17/2022 at 11:31 am to provider BRENT BREEDLOVE , who verbally acknowledged these results. Electronically Signed   By: Van Clines M.D.   On: 09/17/2022 11:38   Result Date: 09/17/2022 CLINICAL DATA:  History of blood clots. Loss of appetite and weight loss. EXAM: CT ANGIOGRAPHY CHEST CT ABDOMEN AND PELVIS WITH CONTRAST TECHNIQUE: Multidetector  CT imaging of the chest was performed using the standard protocol during bolus administration of intravenous contrast. Multiplanar CT image reconstructions and MIPs were obtained to evaluate the vascular anatomy. Multidetector CT imaging of the abdomen and pelvis was performed using the standard protocol during bolus administration of intravenous contrast. RADIATION DOSE REDUCTION: This exam was performed according to the departmental dose-optimization program which includes automated exposure control, adjustment of the mA and/or kV according to patient size and/or use of iterative reconstruction technique. CONTRAST:  138m OMNIPAQUE IOHEXOL 350 MG/ML SOLN COMPARISON:  CT abdomen 07/21/2009 FINDINGS: CTA CHEST FINDINGS Cardiovascular: Filling defects compatible with acute pulmonary embolus observed in the right lower lobe and left lower lobe at lobar and segmental levels. Clot burden moderate. Right ventricular to left ventricular ratio elevated at 1.17, indicating right heart strain. Mild atheromatous calcification of the aortic arch and left anterior descending coronary artery. Mediastinum/Nodes: Unremarkable Lungs/Pleura: Wedge shaped consolidation in the posterior basal segment right lower lobe peripherally at 2.7 by 1.4 cm on image 59 series 5, probably a pulmonary infarct given the morphology and location, but possibly from pulmonary hemorrhage. Based on morphology this is substantially less likely to be a pulmonary malignancy. Right lower lobe subpleural nodule measures 7 by 6 by 6 mm (volume = 100 mm^3) on image 42 series 5. Musculoskeletal: Mild levoconvex upper thoracic scoliosis. Lower thoracic spondylosis. Review of the MIP images confirms the above findings. CT ABDOMEN and PELVIS FINDINGS Hepatobiliary: Sharply defined fluid density 1.2 by 1.0 cm left hepatic lobe lesion on image 22 series 11 compatible with cyst. No further imaging workup of this lesion is indicated. 0.4 cm hypodense lesion inferiorly  in the right hepatic lobe on image 41 series 11 is sharply defined and probably also a cyst, although technically too small to characterize. No further imaging workup of this lesion is  indicated. Gallbladder unremarkable.  No biliary dilatation. Pancreas: Unremarkable Spleen: Unremarkable Adrenals/Urinary Tract: Bilateral Bosniak category 1 cysts of both kidneys. No further imaging workup of these lesions is indicated. Right kidney lower pole 1.1 by 0.7 by 0.8 cm Bosniak category 2 cyst, internal density 28 Hounsfield units, calcifications along the margin. No further imaging workup of this lesion is indicated. Both adrenal glands appear normal. Stomach/Bowel: Mild sigmoid colon diverticulosis. Vascular/Lymphatic: Upper normal size right gastric node 0.9 cm in short axis, image 27 series 11. Moderate aortic and branch vessel atheromatous vascular calcifications. Reproductive: Prostatomegaly, prostate gland measures 6.2 by 4.4 by 5.0 cm (volume = 71 cm^3). Other: No supplemental non-categorized findings. Musculoskeletal: Lower lumbar spondylosis and degenerative disc disease resulting in bilateral foraminal impingement at L3-4 and L4-5, and right foraminal impingement at L5-S1. Review of the MIP images confirms the above findings. IMPRESSION: 1. Acute bilateral lower lobe pulmonary emboli. Clot burden moderate. Positive for acute PE with CT evidence of right heart strain (RV/LV Ratio = 1.17 ) consistent with at least submassive (intermediate risk) PE. The presence of right heart strain has been associated with an increased risk of morbidity and mortality. Please refer to the "PE Focused" order set in EPIC. 2. Wedge shaped consolidation in the posterior basal segment right lower lobe, probably a pulmonary infarct given the morphology and location, but possibly from pulmonary hemorrhage. 3. 7 mm right lower lobe subpleural nodule. Non-contrast chest CT at 6-12 months is recommended. If the nodule is stable at time of  repeat CT, then future CT at 18-24 months (from today's scan) is considered optional for low-risk patients, but is recommended for high-risk patients. This recommendation follows the consensus statement: Guidelines for Management of Incidental Pulmonary Nodules Detected on CT Images: From the Fleischner Society 2017; Radiology 2017; 284:228-243. 4. Aortic Atherosclerosis (ICD10-I70.0). Radiology assistant personnel have been notified to put me in telephone contact with the referring physician or the referring physician's clinical representative in order to discuss these findings. Once this communication is established I will issue an addendum to this report for documentation purposes. Electronically Signed: By: Van Clines M.D. On: 09/17/2022 11:27    ASSESSMENT & PLAN MELBERN CEFALO is a 62 y.o. 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   #Recurrent DVT/PE: --First episode was a PE and RLE DVT in June 2021. Felt to be provoked and treated with eliquis for 6 months.  --Second episode was bilateral PE and RLE in February 2024. No provoking factor identified including prolonged travel/immobility, surgery (particular abdominal or orthropedic), trauma,  and hormone therapy. Currently on Eliquis therapy.  --Recommend indefinite anticoagulation due to recurrent venous thromboembolism and most recent episode felt to be unprovoked.  --Patient does have family history of VTEs including father with DVT and mother with PE. --Continue on Eliquis 5 mg PO twice daily --Labs today to check CBC, CMP and complete hypercoagulable workup.   --RTC in 6 months with labs and follow up to discuss role of maintenance therapy.   #Family history of cancer: --Patient reports two paternal uncles were diagnosed with pancreatic cancer and cousin with stomach cancer --Patient is interested in genetic testing so referral is placed.  --Encouraged patient to stay up to date on his age  appropriate cancer screenings including colonoscopy and PSA levels.   Orders Placed This Encounter  Procedures   Antithrombin III   Protein C activity   Protein C, total   Protein S activity   Protein S,  total   Lupus anticoagulant panel   Beta-2-glycoprotein i abs, IgG/M/A   Homocysteine, serum   Factor 5 leiden   Prothrombin gene mutation   Cardiolipin antibodies, IgG, IgM, IgA   CBC with Differential (Cancer Center Only)    Standing Status:   Future    Number of Occurrences:   1    Standing Expiration Date:   10/09/2023   CMP ([place] only)    Standing Status:   Future    Number of Occurrences:   1    Standing Expiration Date:   10/09/2023   Plasminogen Activator Inhibitor1 4G/5G Polymorphism    Standing Status:   Future    Number of Occurrences:   1    Standing Expiration Date:   10/09/2023   PTT-LA Mix   Hexagonal Phase Phospholipid   dRVVT Mix   dRVVT Confirm    All questions were answered. The patient knows to call the clinic with any problems, questions or concerns.  I have spent a total of 60 minutes minutes of face-to-face and non-face-to-face time, preparing to see the patient, obtaining and/or reviewing separately obtained history, performing a medically appropriate examination, counseling and educating the patient, ordering tests/procedures, referring and communicating with other health care professionals, documenting clinical information in the electronic health record,and care coordination.   Dede Query, PA-C Department of Hematology/Oncology [facility] Phone: [phone]  Patient was seen with Dr. Keniyah Gelinas Limbo.    ADDENDUM  .Patient was Personally and independently interviewed, examined and relevant elements of the history of present illness were reviewed in details and an assessment and plan was created. All elements of the patient's history of present illness , assessment and plan were discussed in details with  Dede Query, PA-C. The above documentation reflects our combined findings assessment and plan. Patient with extensive bilateral PEs without any definitive provoking factors.  He is likely will require long-term anticoagulation but a hypercoagulability testing panel might help define the etiology further in about 50% of patients and was sent out today.  If a particular provoking factor was noted on his hypercoagulability testing that might also guide recommendations for maintenance treatment.  Sullivan Lone MD MS

## 2022-10-08 NOTE — Telephone Encounter (Signed)
Patient called back and scheduled for 10/11/2022 at 10:00 am

## 2022-10-08 NOTE — Telephone Encounter (Signed)
Left message for patient to call and discuss earlier appointment for the Echo ordered by Dr. Mickle Asper 10/11/22 at 10:00 am---

## 2022-10-09 ENCOUNTER — Other Ambulatory Visit: Payer: Self-pay

## 2022-10-09 ENCOUNTER — Inpatient Hospital Stay: Payer: Federal, State, Local not specified - PPO

## 2022-10-09 ENCOUNTER — Inpatient Hospital Stay: Payer: Federal, State, Local not specified - PPO | Attending: Physician Assistant | Admitting: Physician Assistant

## 2022-10-09 ENCOUNTER — Encounter: Payer: Self-pay | Admitting: Physician Assistant

## 2022-10-09 VITALS — BP 138/90 | HR 70 | Temp 97.3°F | Resp 13 | Wt 174.3 lb

## 2022-10-09 DIAGNOSIS — Z7901 Long term (current) use of anticoagulants: Secondary | ICD-10-CM

## 2022-10-09 DIAGNOSIS — Z79899 Other long term (current) drug therapy: Secondary | ICD-10-CM

## 2022-10-09 DIAGNOSIS — Z809 Family history of malignant neoplasm, unspecified: Secondary | ICD-10-CM

## 2022-10-09 DIAGNOSIS — I2699 Other pulmonary embolism without acute cor pulmonale: Secondary | ICD-10-CM

## 2022-10-09 DIAGNOSIS — Z86718 Personal history of other venous thrombosis and embolism: Secondary | ICD-10-CM | POA: Diagnosis not present

## 2022-10-09 LAB — CMP (CANCER CENTER ONLY)
ALT: 16 U/L (ref 0–44)
AST: 21 U/L (ref 15–41)
Albumin: 4.7 g/dL (ref 3.5–5.0)
Alkaline Phosphatase: 118 U/L (ref 38–126)
Anion gap: 6 (ref 5–15)
BUN: 16 mg/dL (ref 6–20)
CO2: 28 mmol/L (ref 22–32)
Calcium: 9.8 mg/dL (ref 8.9–10.3)
Chloride: 105 mmol/L (ref 98–111)
Creatinine: 1.08 mg/dL (ref 0.61–1.24)
GFR, Estimated: >60 mL/min (ref >60)
Glucose, Bld: 98 mg/dL (ref 70–99)
Potassium: 4.3 mmol/L (ref 3.5–5.1)
Sodium: 139 mmol/L (ref 135–145)
Total Bilirubin: 0.9 mg/dL (ref 0.3–1.2)
Total Protein: 8.3 g/dL — ABNORMAL HIGH (ref 6.5–8.1)

## 2022-10-09 LAB — ANTITHROMBIN III: AntiThromb III Func: 92 % (ref 75–120)

## 2022-10-09 LAB — CBC WITH DIFFERENTIAL (CANCER CENTER ONLY)
Abs Immature Granulocytes: 0.01 10*3/uL (ref 0.00–0.07)
Basophils Absolute: 0 10*3/uL (ref 0.0–0.1)
Basophils Relative: 1 %
Eosinophils Absolute: 0.1 10*3/uL (ref 0.0–0.5)
Eosinophils Relative: 1 %
HCT: 43.2 % (ref 39.0–52.0)
Hemoglobin: 14.7 g/dL (ref 13.0–17.0)
Immature Granulocytes: 0 %
Lymphocytes Relative: 33 %
Lymphs Abs: 1.7 10*3/uL (ref 0.7–4.0)
MCH: 30.9 pg (ref 26.0–34.0)
MCHC: 34 g/dL (ref 30.0–36.0)
MCV: 90.9 fL (ref 80.0–100.0)
Monocytes Absolute: 0.3 10*3/uL (ref 0.1–1.0)
Monocytes Relative: 6 %
Neutro Abs: 3.1 10*3/uL (ref 1.7–7.7)
Neutrophils Relative %: 59 %
Platelet Count: 208 10*3/uL (ref 150–400)
RBC: 4.75 MIL/uL (ref 4.22–5.81)
RDW: 12.5 % (ref 11.5–15.5)
WBC Count: 5.2 10*3/uL (ref 4.0–10.5)
nRBC: 0 % (ref 0.0–0.2)

## 2022-10-10 ENCOUNTER — Telehealth: Payer: Self-pay | Admitting: Hematology

## 2022-10-10 LAB — HOMOCYSTEINE: Homocysteine: 15 umol/L — ABNORMAL HIGH (ref 0.0–14.5)

## 2022-10-10 NOTE — Telephone Encounter (Signed)
Per 3/13 LOS reached out to patient to schedule, left voicemail.

## 2022-10-11 ENCOUNTER — Telehealth: Payer: Self-pay | Admitting: Hematology

## 2022-10-11 ENCOUNTER — Ambulatory Visit (INDEPENDENT_AMBULATORY_CARE_PROVIDER_SITE_OTHER): Payer: Federal, State, Local not specified - PPO

## 2022-10-11 DIAGNOSIS — I2609 Other pulmonary embolism with acute cor pulmonale: Secondary | ICD-10-CM

## 2022-10-11 DIAGNOSIS — Z86711 Personal history of pulmonary embolism: Secondary | ICD-10-CM

## 2022-10-11 DIAGNOSIS — I088 Other rheumatic multiple valve diseases: Secondary | ICD-10-CM | POA: Diagnosis not present

## 2022-10-11 LAB — BETA-2-GLYCOPROTEIN I ABS, IGG/M/A
Beta-2 Glyco I IgG: <9 GPI IgG units (ref 0–20)
Beta-2-Glycoprotein I IgA: <9 GPI IgA units (ref 0–25)
Beta-2-Glycoprotein I IgM: <9 GPI IgM units (ref 0–32)

## 2022-10-11 LAB — ECHOCARDIOGRAM COMPLETE
Area-P 1/2: 2.99 cm2
MV M vel: 2.76 m/s
MV Peak grad: 30.5 mmHg
S' Lateral: 2.77 cm

## 2022-10-11 LAB — PROTEIN S, TOTAL: Protein S Ag, Total: 101 % (ref 60–150)

## 2022-10-11 LAB — LUPUS ANTICOAGULANT PANEL
DRVVT: 55.7 s — ABNORMAL HIGH (ref 0.0–47.0)
PTT Lupus Anticoagulant: 48.3 s — ABNORMAL HIGH (ref 0.0–43.5)

## 2022-10-11 LAB — PROTEIN S ACTIVITY: Protein S Activity: 101 % (ref 63–140)

## 2022-10-11 LAB — DRVVT MIX: dRVVT Mix: 46.9 s — ABNORMAL HIGH (ref 0.0–40.4)

## 2022-10-11 LAB — PROTEIN C ACTIVITY: Protein C Activity: 84 % (ref 73–180)

## 2022-10-11 LAB — HEXAGONAL PHASE PHOSPHOLIPID: Hexagonal Phase Phospholipid: 8 s (ref 0–11)

## 2022-10-11 LAB — PTT-LA MIX: PTT-LA Mix: 47.4 s — ABNORMAL HIGH (ref 0.0–40.5)

## 2022-10-11 LAB — DRVVT CONFIRM: dRVVT Confirm: 1.1 ratio (ref 0.8–1.2)

## 2022-10-11 LAB — PROTEIN C, TOTAL: Protein C, Total: 86 % (ref 60–150)

## 2022-10-11 NOTE — Telephone Encounter (Signed)
Per 3/14 IB reached out to patient to schedule, patient aware of date and time of appointment.

## 2022-10-12 LAB — CARDIOLIPIN ANTIBODIES, IGG, IGM, IGA
Anticardiolipin IgA: <9 APL U/mL (ref 0–11)
Anticardiolipin IgG: <9 GPL U/mL (ref 0–14)
Anticardiolipin IgM: <9 MPL U/mL (ref 0–12)

## 2022-10-22 LAB — MISC LABCORP TEST (SEND OUT): Labcorp test code: 500309

## 2022-10-22 LAB — PROTHROMBIN GENE MUTATION

## 2022-10-23 ENCOUNTER — Telehealth: Payer: Self-pay | Admitting: Physician Assistant

## 2022-10-23 LAB — FACTOR 5 LEIDEN

## 2022-10-23 NOTE — Telephone Encounter (Signed)
I called [name] White to review the lab results from 10/09/2022. 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 I explained both these genetic mutations can increase risk of venous thromboembolisms. Recommend for patient's children, especially his daughter, to be tested. Reviewed recommendation for indefinite anticoagulation. Confirmed with Dr. Lorenso Courier that patient can continue on Eliquis therapy with these underlying mutations. Patient will follow up with Dr. Seriyah Collison Limbo in September 2024 for a follow up visit. He expressed understanding of the plan provided.

## 2022-10-30 ENCOUNTER — Other Ambulatory Visit (HOSPITAL_BASED_OUTPATIENT_CLINIC_OR_DEPARTMENT_OTHER): Payer: Federal, State, Local not specified - PPO

## 2022-11-07 DIAGNOSIS — I10 Essential (primary) hypertension: Secondary | ICD-10-CM | POA: Diagnosis not present

## 2022-11-07 DIAGNOSIS — E7800 Pure hypercholesterolemia, unspecified: Secondary | ICD-10-CM | POA: Diagnosis not present

## 2022-11-07 DIAGNOSIS — Z0000 Encounter for general adult medical examination without abnormal findings: Secondary | ICD-10-CM | POA: Diagnosis not present

## 2022-11-29 ENCOUNTER — Inpatient Hospital Stay: Payer: Federal, State, Local not specified - PPO | Attending: Physician Assistant | Admitting: Genetic Counselor

## 2022-11-29 ENCOUNTER — Other Ambulatory Visit: Payer: Self-pay

## 2022-11-29 ENCOUNTER — Inpatient Hospital Stay: Payer: Federal, State, Local not specified - PPO

## 2022-11-29 ENCOUNTER — Encounter: Payer: Self-pay | Admitting: Genetic Counselor

## 2022-11-29 DIAGNOSIS — Z800 Family history of malignant neoplasm of digestive organs: Secondary | ICD-10-CM | POA: Insufficient documentation

## 2022-11-29 DIAGNOSIS — M25562 Pain in left knee: Secondary | ICD-10-CM | POA: Diagnosis not present

## 2022-11-29 LAB — GENETIC SCREENING ORDER

## 2022-11-29 NOTE — Progress Notes (Signed)
REFERRING PROVIDER: Joycelyn Rua, MD [address] [place],  [place] [address]  PRIMARY PROVIDER:  Joycelyn Rua, MD  PRIMARY REASON FOR VISIT:  1. Family history of pancreatic cancer     HISTORY OF PRESENT ILLNESS:   Julian White, a 62 y.o. male, was seen for a [facility] cancer genetics consultation at the request of Dr. Lenise Arena due to a family history of cancer.  Julian White presents to clinic today to discuss the possibility of a hereditary predisposition to cancer, to discuss genetic testing, and to further clarify his future cancer risks, as well as potential cancer risks for family members.   Julian White is a 62 y.o. male with no personal history of cancer.    Past Medical History:  Diagnosis Date   Anginal pain (HCC)    CAD (coronary artery disease)    minimal plaque by LHC 11/13:LHC 06/27/12:  dLM 20%, proximal and mid LAD 20%, EF 55% with normal wall motion     Headache(784.0)    Heart murmur    Hx of echocardiogram    Echo 06/27/12: Mild LVH, EF 65-70%.   Hypertension    per pt dx 20 years but has not had any meds in 20 yrs per md   NSTEMI (non-ST elevated myocardial infarction) (HCC)    05/2012:  Type 2 in setting of SVT (HR 190s)   SVT (supraventricular tachycardia)     Past Surgical History:  Procedure Laterality Date   ABLATION     svt   CARDIAC CATHETERIZATION     COLONOSCOPY     LEFT HEART CATHETERIZATION WITH CORONARY ANGIOGRAM N/A 06/27/2012   Procedure: LEFT HEART CATHETERIZATION WITH CORONARY ANGIOGRAM;  Surgeon: Dolores Patty, MD;  Location: [facility] CATH LAB;  Service: Cardiovascular;  Laterality: N/A;   ORIF ELBOW FRACTURE Left 01/13/2020   Procedure: OPEN REDUCTION INTERNAL FIXATION (ORIF) ELBOW ulna and radius FRACTURE;  Surgeon: Bradly Bienenstock, MD;  Location: [facility] OR;  Service: Orthopedics;  Laterality: Left;  with regional block   SUPRAVENTRICULAR TACHYCARDIA ABLATION N/A 07/17/2012   Procedure: SUPRAVENTRICULAR TACHYCARDIA ABLATION;  Surgeon:  Marinus Maw, MD;  Location: [facility] CATH LAB;  Service: Cardiovascular;  Laterality: N/A;    Social History   Socioeconomic History   Marital status: Married    Spouse name: Not on file   Number of children: Not on file   Years of education: Not on file   Highest education level: Not on file  Occupational History   Not on file  Tobacco Use   Smoking status: Never   Smokeless tobacco: Never  Substance and Sexual Activity   Alcohol use: No   Drug use: No   Sexual activity: Yes  Other Topics Concern   Not on file  Social History Narrative   Not on file   Social Determinants of Health   Financial Resource Strain: Not on file  Food Insecurity: No Food Insecurity (10/09/2022)   Hunger Vital Sign    Worried About Running Out of Food in the Last Year: Never true    Ran Out of Food in the Last Year: Never true  Transportation Needs: No Transportation Needs (10/09/2022)   PRAPARE - Administrator, Civil Service (Medical): No    Lack of Transportation (Non-Medical): No  Physical Activity: Not on file  Stress: Not on file  Social Connections: Not on file     FAMILY HISTORY:  We obtained a detailed, 4-generation family history.  Significant diagnoses are  listed below: Family History  Problem Relation Age of Onset   Pulmonary embolism Mother    CAD Father        s/p CABG at 21   Deep vein thrombosis Father    Melanoma Father 45 - 49   CAD Brother        s/p MI in his 13s   Pancreatic cancer Paternal Uncle 33 - 79   Pancreatic cancer Paternal Uncle 9 - 79   Pancreatic cancer Paternal Uncle 8 - 64   Cancer Maternal Grandmother        unknown type   Pancreatic cancer Cousin 42       paternal first cousin     Julian White has four paternal uncles. Three paternal uncles have a history of pancreatic cancer diagnosed in their 60s, the died shortly after their diagnoses. One paternal first cousin was diagnosed with pancreatic cancer at age 80, he died at age 42. [name] father was diagnosed with melanoma in his 5s, he died at age 75. Julian White maternal grandmother was diagnosed with cancer (unknown type), she is deceased. One maternal great aunt was diagnosed with breast cancer, she is deceased. Julian White is unaware of previous family history of genetic testing for hereditary cancer risks. There is no reported Ashkenazi Jewish ancestry.   GENETIC COUNSELING ASSESSMENT: Julian White is a 62 y.o. male with a family history of cancer which is somewhat suggestive of a hereditary predisposition to cancer given multiple family members diagnosed with pancreatic cancer. We, therefore, discussed and recommended the following at today's visit.   DISCUSSION: We discussed that 5 - 10% of cancer is hereditary, with most cases of hereditary pancreatic cancer associated with BRCA1/2 or CDKN2A.  There are other genes that can be associated with hereditary pancreatic cancer syndromes.  We discussed that testing is beneficial for several reasons, including knowing about other cancer risks, identifying potential screening and risk-reduction options that may be appropriate, and to understanding if other family members could be at risk for cancer and allowing them to undergo genetic testing.  We reviewed the characteristics, features and inheritance patterns of hereditary cancer syndromes. We also discussed genetic testing, including the appropriate family members to test, the process of testing, insurance coverage and turn-around-time for results. We discussed the implications of a negative, positive, carrier and/or variant of uncertain significant result. We discussed that negative results would be uninformative given that Julian White does not have a personal history of cancer. We recommended Julian White pursue genetic testing for a panel that contains genes associated with pancreatic cancer.  Julian White was offered a common hereditary cancer panel (48 genes) and an expanded pan-cancer  panel (70 genes). Julian White was informed of the benefits and limitations of each panel, including that expanded pan-cancer panels contain several genes that do not have clear management guidelines at this point in time.  We also discussed that as the number of genes included on a panel increases, the chances of variants of uncertain significance increases.  After considering the benefits and limitations of each gene panel, Julian White elected to have [facility] Multi-Cancer Panel.   The Multi-Cancer + RNA Panel offered by [facility] includes sequencing and/or deletion/duplication analysis of the following 70 genes:  AIP*, ALK, APC*, ATM*, AXIN2*, BAP1*, BARD1*, BLM*, BMPR1A*, BRCA1*, BRCA2*, BRIP1*, CDC73*, CDH1*, CDK4, CDKN1B*, CDKN2A, CHEK2*, CTNNA1*, DICER1*, EPCAM (del/dup only), EGFR, FH*, FLCN*, GREM1 (promoter dup only), HOXB13, KIT, LZTR1, MAX*, MBD4, MEN1*, MET, MITF, MLH1*, MSH2*, MSH3*,  MSH6*, MUTYH*, NF1*, NF2*, NTHL1*, PALB2*, PDGFRA, PMS2*, POLD1*, POLE*, POT1*, PRKAR1A*, PTCH1*, PTEN*, RAD51C*, RAD51D*, RB1*, RET, SDHA* (sequencing only), SDHAF2*, SDHB*, SDHC*, SDHD*, SMAD4*, SMARCA4*, SMARCB1*, SMARCE1*, STK11*, SUFU*, TMEM127*, TP53*, TSC1*, TSC2*, VHL*. RNA analysis is performed for * genes.  Based on Julian White's family history of cancer, he meets medical criteria for genetic testing. Despite that he meets criteria, he may still have an out of pocket cost. We discussed that if his out of pocket cost for testing is over $100, the laboratory will call and confirm whether he wants to proceed with testing.  If the out of pocket cost of testing is less than $100 he will be billed by the genetic testing laboratory.   We discussed that some people do not want to undergo genetic testing due to fear of genetic discrimination.  A federal law called the Genetic Information Non-Discrimination Act (GINA) of 2008 helps protect individuals against genetic discrimination based on their genetic test results.  It  impacts both health insurance and employment.  With health insurance, it protects against increased premiums, being kicked off insurance or being forced to take a test in order to be insured.  For employment it protects against hiring, firing and promoting decisions based on genetic test results.  GINA does not apply to those in the [employer], those who work for companies with less than 15 employees, and new life insurance or long-term disability insurance policies.  Health status due to a cancer diagnosis is not protected under GINA.  PLAN: After considering the risks, benefits, and limitations, Julian White provided informed consent to pursue genetic testing and the blood sample was sent to [facility] for analysis of the Multi-Cancer Panel. Results should be available within approximately 2-3 weeks' time, at which point they will be disclosed by telephone to Mr. Roach, as will any additional recommendations warranted by these results. Mr. Rufenacht will receive a summary of his genetic counseling visit and a copy of his results once available. This information will also be available in Epic.   Mr. Goedeke questions were answered to his satisfaction today. Our contact information was provided should additional questions or concerns arise. Thank you for the referral and allowing [place] to share in the care of your patient.   Lalla Brothers, MS, [facility] Genetic Counselor [email or web address] (P) [phone]  The patient was seen for a total of 30 minutes in face-to-face genetic counseling. The patient was seen alone.  Drs. Pamelia Hoit and/or Mosetta Putt were available to discuss this case as needed.  _______________________________________________________________________ For Office Staff:  Number of people involved in session: 1 Was an Intern/ student involved with case: no

## 2022-11-30 ENCOUNTER — Encounter: Payer: Self-pay | Admitting: Genetic Counselor

## 2022-12-10 ENCOUNTER — Encounter (HOSPITAL_BASED_OUTPATIENT_CLINIC_OR_DEPARTMENT_OTHER): Payer: Self-pay | Admitting: Pulmonary Disease

## 2022-12-10 ENCOUNTER — Ambulatory Visit (HOSPITAL_BASED_OUTPATIENT_CLINIC_OR_DEPARTMENT_OTHER): Payer: Federal, State, Local not specified - PPO | Admitting: Pulmonary Disease

## 2022-12-10 VITALS — BP 120/86 | HR 61 | Temp 97.9°F | Ht 71.0 in | Wt 173.0 lb

## 2022-12-10 DIAGNOSIS — R911 Solitary pulmonary nodule: Secondary | ICD-10-CM

## 2022-12-10 DIAGNOSIS — Z86711 Personal history of pulmonary embolism: Secondary | ICD-10-CM | POA: Diagnosis not present

## 2022-12-10 NOTE — Patient Instructions (Signed)
Will make sure your CT chest is scheduled for August 2024 and follow up after this

## 2022-12-10 NOTE — Progress Notes (Signed)
[place] [facility]  Chief Complaint  Patient presents with   Follow-up    Follow up. Patient has no complaints.     Past Surgical History:  He  has a past surgical history that includes Cardiac catheterization; ABLATION; left heart catheterization with coronary angiogram (N/A, 06/27/2012); supraventricular tachycardia ablation (N/A, 07/17/2012); Colonoscopy; and ORIF elbow fracture (Left, 01/13/2020).  Past Medical History:  CAD, Headache, HTN, SVT, Rt leg DVT June 2021 and February 2024, PE June 2021 and February 2024  Constitutional:  BP 120/86 (BP Location: Left Arm, Patient Position: Sitting, Cuff Size: Normal)   Pulse 61   Temp 97.9 F (36.6 C) (Oral)   Ht 5\' 11"  (1.803 m)   Wt 173 lb (78.5 kg)   SpO2 100%   BMI 24.13 kg/m   Brief Summary:  Julian White is a 62 y.o. male with recurrent thromboembolic disease.      Subjective:   Echo was normal.  Saw hematology.  Positive for Factor 5 Leiden and PAI genes.  Saw geneticist because he also has family history of pancreatic cancer.  Not having cough, chest pain, dyspnea, leg swelling, bruising.  Doesn't feel like his breathing limits his activity level.  Physical Exam:   Appearance - well kempt   ENMT - no sinus tenderness, no oral exudate, no LAN, Mallampati 2 airway, no stridor  Respiratory - equal breath sounds bilaterally, no wheezing or rales  CV - s1s2 regular rate and rhythm, no murmurs  Ext - no clubbing, no edema  Skin - no rashes  Psych - normal mood and affect    Pulmonary testing:    Chest Imaging:  CT angio chest 01/17/20 >> b/l PE CT angio chest 09/17/22 >> acute PE RLL and LLL, RV:LV 1.17, pulmonary infarct RLL, RLL 8 x 6 x 6 mm nodule  Cardiac Tests:  Echo 10/11/22 >> EF 55 to 60%, RVSP 26.4 mmHg  Social History:  He  reports that he has never smoked. He has never used smokeless tobacco. He reports that he does not drink alcohol and does not use  drugs.  Family History:  His family history includes CAD in his brother and father; Cancer in his maternal grandmother; Deep vein thrombosis in his father; Melanoma (age of onset: 12 - 10) in his father; Pancreatic cancer (age of onset: 69) in his cousin; Pancreatic cancer (age of onset: 67 - 39) in his paternal uncle, paternal uncle, and paternal uncle; Pulmonary embolism in his mother.     Assessment/Plan:   Recurrent thromboembolic disease. - followed by Dr. Wyvonnia Lora with hematology/oncology - has heterozygous mutation for Factor 5 Leiden and homozygous mutation for Plasminogen activator inhibitor - continue indefinite anticoagulation therapy with eliquis; he will get refills from his PCP  Right lower lobe lung nodule. - will get CT chest without contrast in August 2024; can also assess resolution of pulmonary infarct at that time  Time Spent Involved in Patient Care on Day of Examination:  25 minutes  Follow up:   Patient Instructions  Will make sure your CT chest is scheduled for August 2024 and follow up after this   Medication List:   Allergies as of 12/10/2022   No Known Allergies      Medication List        Accurate as of Dec 10, 2022 10:21 AM. If you have any questions, ask your nurse or doctor.          STOP taking  these medications    acetaminophen 500 MG tablet Commonly known as: TYLENOL Stopped by: Coralyn Helling, MD       TAKE these medications    amLODipine-benazepril 5-10 MG capsule Commonly known as: LOTREL Take 1 capsule by mouth daily.   Eliquis DVT/PE Starter Pack Generic drug: Apixaban Starter Pack (10mg  and 5mg ) Take as directed on package: start with two-5mg  tablets twice daily for 7 days. On day 8, switch to one-5mg  tablet twice daily.   rosuvastatin 5 MG tablet Commonly known as: CRESTOR Take 5 mg by mouth daily.        Signature:  Coralyn Helling, MD [facility] Pulmonary/Critical Care Pager - [phone] 12/10/2022, 10:21  AM

## 2022-12-12 DIAGNOSIS — K080 Exfoliation of teeth due to systemic causes: Secondary | ICD-10-CM | POA: Diagnosis not present

## 2022-12-17 DIAGNOSIS — K029 Dental caries, unspecified: Secondary | ICD-10-CM | POA: Diagnosis not present

## 2022-12-21 ENCOUNTER — Ambulatory Visit: Payer: Self-pay | Admitting: Genetic Counselor

## 2022-12-21 ENCOUNTER — Encounter: Payer: Self-pay | Admitting: Genetic Counselor

## 2022-12-21 ENCOUNTER — Telehealth: Payer: Self-pay | Admitting: Genetic Counselor

## 2022-12-21 DIAGNOSIS — Z1379 Encounter for other screening for genetic and chromosomal anomalies: Secondary | ICD-10-CM

## 2022-12-21 DIAGNOSIS — M25562 Pain in left knee: Secondary | ICD-10-CM | POA: Diagnosis not present

## 2022-12-21 NOTE — Progress Notes (Signed)
HPI:   Mr. Bermudez was previously seen in the [facility] due to a family history of cancer and concerns regarding a hereditary predisposition to cancer. Please refer to our prior cancer genetics clinic note for more information regarding our discussion, assessment and recommendations, at the time. Mr. Dacanay recent genetic test results were disclosed to him, as were recommendations warranted by these results. These results and recommendations are discussed in more detail below.  FAMILY HISTORY:  We obtained a detailed, 4-generation family history.  Significant diagnoses are listed below:      Family History  Problem Relation Age of Onset   Pulmonary embolism Mother     CAD Father          s/p CABG at 26   Deep vein thrombosis Father     Melanoma Father 68 - 57   CAD Brother          s/p MI in his 76s   Pancreatic cancer Paternal Uncle 77 - 79   Pancreatic cancer Paternal Uncle 52 - 79   Pancreatic cancer Paternal Uncle 67 - 5   Cancer Maternal Grandmother          unknown type   Pancreatic cancer Cousin 31        paternal first cousin       Mr. Baria has four paternal uncles. Three paternal uncles have a history of pancreatic cancer diagnosed in their 57s, the died shortly after their diagnoses. One paternal first cousin was diagnosed with pancreatic cancer at age 56, he died at age 55. Mr. Bethell father was diagnosed with melanoma in his 62s, he died at age 55. Mr. Pride maternal grandmother was diagnosed with cancer (unknown type), she is deceased. One maternal great aunt was diagnosed with breast cancer, she is deceased. Mr. Rosenberg is unaware of previous family history of genetic testing for hereditary cancer risks. There is no reported Ashkenazi Jewish ancestry.   GENETIC TEST RESULTS:  The [facility] Multi-Cancer Panel found no pathogenic mutations.  The Multi-Cancer + RNA Panel offered by [facility] includes sequencing and/or deletion/duplication analysis of  the following 70 genes:  AIP*, ALK, APC*, ATM*, AXIN2*, BAP1*, BARD1*, BLM*, BMPR1A*, BRCA1*, BRCA2*, BRIP1*, CDC73*, CDH1*, CDK4, CDKN1B*, CDKN2A, CHEK2*, CTNNA1*, DICER1*, EPCAM (del/dup only), EGFR, FH*, FLCN*, GREM1 (promoter dup only), HOXB13, KIT, LZTR1, MAX*, MBD4, MEN1*, MET, MITF, MLH1*, MSH2*, MSH3*, MSH6*, MUTYH*, NF1*, NF2*, NTHL1*, PALB2*, PDGFRA, PMS2*, POLD1*, POLE*, POT1*, PRKAR1A*, PTCH1*, PTEN*, RAD51C*, RAD51D*, RB1*, RET, SDHA* (sequencing only), SDHAF2*, SDHB*, SDHC*, SDHD*, SMAD4*, SMARCA4*, SMARCB1*, SMARCE1*, STK11*, SUFU*, TMEM127*, TP53*, TSC1*, TSC2*, VHL*. RNA analysis is performed for * genes.  The test report has been scanned into EPIC and is located under the Molecular Pathology section of the Results Review tab.  A portion of the result report is included below for reference. Genetic testing reported out on 12/08/2022.     Even though a pathogenic variant was not identified, possible explanations for the cancer in the family may include: There may be no hereditary risk for cancer in the family. The cancers in his family may be due to other genetic or environmental factors. There may be a gene mutation in one of these genes that current testing methods cannot detect, but that chance is small. There could be another gene that has not yet been discovered, or that we have not yet tested, that is responsible for the cancer diagnoses in the family.  It is also possible there is a hereditary cause  for the cancer in the family that Mr. Gordin did not inherit.  Therefore, it is important to remain in touch with cancer genetics in the future so that we can continue to offer Mr. Viverette the most up to date genetic testing.   ADDITIONAL GENETIC TESTING:  We discussed with Mr. Hosek that his genetic testing was fairly extensive.  If there are genes identified to increase cancer risk that can be analyzed in the future, we would be happy to discuss and coordinate this testing at that  time.    CANCER SCREENING RECOMMENDATIONS:  Mr. Khim test result is considered negative (normal).  This means that we have not identified a hereditary cause for his family history of cancer at this time.   An individual's cancer risk and medical management are not determined by genetic test results alone. Overall cancer risk assessment incorporates additional factors, including personal medical history, family history, and any available genetic information that may result in a personalized plan for cancer prevention and surveillance. Therefore, it is recommended he continue to follow the cancer management and screening guidelines provided by his primary healthcare provider.  RECOMMENDATIONS FOR FAMILY MEMBERS:   Since he did not inherit a mutation in a cancer predisposition gene included on this panel, his children could not have inherited a mutation from him in one of these genes. Other members of the family may still carry a pathogenic variant in one of these genes that Mr. Sobocinski did not inherit. Based on the family history, we recommend his siblings have genetic counseling and testing.   FOLLOW-UP:  Cancer genetics is a rapidly advancing field and it is possible that new genetic tests will be appropriate for him and/or his family members in the future. We encouraged him to remain in contact with cancer genetics on an annual basis so we can update his personal and family histories and let him know of advances in cancer genetics that may benefit this family.   Our contact number was provided. Mr. Fama questions were answered to his satisfaction, and he knows he is welcome to call us at anytime with additional questions or concerns.   Lalla Brothers, MS, [facility] Genetic Counselor [email or web address] (P) [phone]

## 2022-12-21 NOTE — Telephone Encounter (Signed)
I attempted to contact Julian White to discuss his genetic testing results (70 genes). I left a voicemail requesting he call me back at [phone].  Lalla Brothers, MS, [facility] Genetic Counselor [email or web address] (P) [phone]

## 2022-12-26 ENCOUNTER — Telehealth: Payer: Self-pay | Admitting: Genetic Counselor

## 2022-12-26 NOTE — Telephone Encounter (Signed)
I contacted Julian White to discuss his genetic testing results. No pathogenic variants were identified in the 70 genes analyzed. Detailed clinic note to follow.  The test report has been scanned into EPIC and is located under the Molecular Pathology section of the Results Review tab.  A portion of the result report is included below for reference.   Julian Brothers, MS, Julian White Genetic Counselor [email or web address] (P) [phone]

## 2022-12-28 ENCOUNTER — Encounter: Payer: Self-pay | Admitting: Genetic Counselor

## 2023-01-09 DIAGNOSIS — M25562 Pain in left knee: Secondary | ICD-10-CM | POA: Diagnosis not present

## 2023-01-09 DIAGNOSIS — S83242S Other tear of medial meniscus, current injury, left knee, sequela: Secondary | ICD-10-CM | POA: Diagnosis not present

## 2023-01-11 DIAGNOSIS — D485 Neoplasm of uncertain behavior of skin: Secondary | ICD-10-CM | POA: Diagnosis not present

## 2023-01-11 DIAGNOSIS — L738 Other specified follicular disorders: Secondary | ICD-10-CM | POA: Diagnosis not present

## 2023-01-15 DIAGNOSIS — K029 Dental caries, unspecified: Secondary | ICD-10-CM | POA: Diagnosis not present

## 2023-01-28 DIAGNOSIS — S83242A Other tear of medial meniscus, current injury, left knee, initial encounter: Secondary | ICD-10-CM | POA: Diagnosis not present

## 2023-01-28 DIAGNOSIS — M659 Synovitis and tenosynovitis, unspecified: Secondary | ICD-10-CM | POA: Diagnosis not present

## 2023-01-28 DIAGNOSIS — M84362A Stress fracture, left tibia, initial encounter for fracture: Secondary | ICD-10-CM | POA: Diagnosis not present

## 2023-01-28 DIAGNOSIS — G8918 Other acute postprocedural pain: Secondary | ICD-10-CM | POA: Diagnosis not present

## 2023-01-28 DIAGNOSIS — S83232A Complex tear of medial meniscus, current injury, left knee, initial encounter: Secondary | ICD-10-CM | POA: Diagnosis not present

## 2023-01-28 DIAGNOSIS — M84364A Stress fracture, left fibula, initial encounter for fracture: Secondary | ICD-10-CM | POA: Diagnosis not present

## 2023-02-05 DIAGNOSIS — M25562 Pain in left knee: Secondary | ICD-10-CM | POA: Diagnosis not present

## 2023-02-07 DIAGNOSIS — M25562 Pain in left knee: Secondary | ICD-10-CM | POA: Diagnosis not present

## 2023-03-02 ENCOUNTER — Ambulatory Visit (HOSPITAL_BASED_OUTPATIENT_CLINIC_OR_DEPARTMENT_OTHER)
Admission: RE | Admit: 2023-03-02 | Discharge: 2023-03-02 | Disposition: A | Discharge status: Home / Self Care | Payer: Federal, State, Local not specified - PPO | Source: Ambulatory Visit | Attending: Pulmonary Disease | Admitting: Pulmonary Disease

## 2023-03-02 DIAGNOSIS — R911 Solitary pulmonary nodule: Secondary | ICD-10-CM | POA: Insufficient documentation

## 2023-03-02 DIAGNOSIS — I700 Atherosclerosis of aorta: Secondary | ICD-10-CM | POA: Diagnosis not present

## 2023-03-02 DIAGNOSIS — R918 Other nonspecific abnormal finding of lung field: Secondary | ICD-10-CM | POA: Diagnosis not present

## 2023-03-02 DIAGNOSIS — I2510 Atherosclerotic heart disease of native coronary artery without angina pectoris: Secondary | ICD-10-CM | POA: Diagnosis not present

## 2023-03-02 DIAGNOSIS — R932 Abnormal findings on diagnostic imaging of liver and biliary tract: Secondary | ICD-10-CM | POA: Diagnosis not present

## 2023-03-15 DIAGNOSIS — D485 Neoplasm of uncertain behavior of skin: Secondary | ICD-10-CM | POA: Diagnosis not present

## 2023-03-15 DIAGNOSIS — L814 Other melanin hyperpigmentation: Secondary | ICD-10-CM | POA: Diagnosis not present

## 2023-03-15 DIAGNOSIS — L738 Other specified follicular disorders: Secondary | ICD-10-CM | POA: Diagnosis not present

## 2023-03-15 DIAGNOSIS — L720 Epidermal cyst: Secondary | ICD-10-CM | POA: Diagnosis not present

## 2023-04-08 ENCOUNTER — Other Ambulatory Visit: Payer: Self-pay | Admitting: Physician Assistant

## 2023-04-08 DIAGNOSIS — Z1379 Encounter for other screening for genetic and chromosomal anomalies: Secondary | ICD-10-CM

## 2023-04-08 DIAGNOSIS — I2699 Other pulmonary embolism without acute cor pulmonale: Secondary | ICD-10-CM

## 2023-04-09 ENCOUNTER — Inpatient Hospital Stay: Payer: Federal, State, Local not specified - PPO | Admitting: Hematology

## 2023-04-09 ENCOUNTER — Inpatient Hospital Stay: Payer: Federal, State, Local not specified - PPO | Attending: Hematology

## 2023-04-09 VITALS — BP 120/80 | HR 81 | Temp 97.7°F | Resp 16 | Ht 71.0 in | Wt 172.8 lb

## 2023-04-09 DIAGNOSIS — Z86711 Personal history of pulmonary embolism: Secondary | ICD-10-CM | POA: Diagnosis not present

## 2023-04-09 DIAGNOSIS — Z86718 Personal history of other venous thrombosis and embolism: Secondary | ICD-10-CM | POA: Diagnosis not present

## 2023-04-09 DIAGNOSIS — R1319 Other dysphagia: Secondary | ICD-10-CM | POA: Diagnosis not present

## 2023-04-09 DIAGNOSIS — Z7901 Long term (current) use of anticoagulants: Secondary | ICD-10-CM | POA: Insufficient documentation

## 2023-04-09 DIAGNOSIS — I2699 Other pulmonary embolism without acute cor pulmonale: Secondary | ICD-10-CM

## 2023-04-09 LAB — CBC WITH DIFFERENTIAL (CANCER CENTER ONLY)
Abs Immature Granulocytes: 0.01 10*3/uL (ref 0.00–0.07)
Basophils Absolute: 0 10*3/uL (ref 0.0–0.1)
Basophils Relative: 1 %
Eosinophils Absolute: 0 10*3/uL (ref 0.0–0.5)
Eosinophils Relative: 1 %
HCT: 41.5 % (ref 39.0–52.0)
Hemoglobin: 14.7 g/dL (ref 13.0–17.0)
Immature Granulocytes: 0 %
Lymphocytes Relative: 27 %
Lymphs Abs: 1.6 10*3/uL (ref 0.7–4.0)
MCH: 31.7 pg (ref 26.0–34.0)
MCHC: 35.4 g/dL (ref 30.0–36.0)
MCV: 89.6 fL (ref 80.0–100.0)
Monocytes Absolute: 0.4 10*3/uL (ref 0.1–1.0)
Monocytes Relative: 7 %
Neutro Abs: 3.9 10*3/uL (ref 1.7–7.7)
Neutrophils Relative %: 64 %
Platelet Count: 194 10*3/uL (ref 150–400)
RBC: 4.63 MIL/uL (ref 4.22–5.81)
RDW: 11.4 % — ABNORMAL LOW (ref 11.5–15.5)
WBC Count: 6 10*3/uL (ref 4.0–10.5)
nRBC: 0 % (ref 0.0–0.2)

## 2023-04-09 LAB — CMP (CANCER CENTER ONLY)
ALT: 11 U/L (ref 0–44)
AST: 15 U/L (ref 15–41)
Albumin: 4.3 g/dL (ref 3.5–5.0)
Alkaline Phosphatase: 88 U/L (ref 38–126)
Anion gap: 3 — ABNORMAL LOW (ref 5–15)
BUN: 17 mg/dL (ref 6–20)
CO2: 31 mmol/L (ref 22–32)
Calcium: 9.5 mg/dL (ref 8.9–10.3)
Chloride: 105 mmol/L (ref 98–111)
Creatinine: 1.18 mg/dL (ref 0.61–1.24)
GFR, Estimated: >60 mL/min (ref >60)
Glucose, Bld: 109 mg/dL — ABNORMAL HIGH (ref 70–99)
Potassium: 4.2 mmol/L (ref 3.5–5.1)
Sodium: 139 mmol/L (ref 135–145)
Total Bilirubin: 1 mg/dL (ref 0.3–1.2)
Total Protein: 7.3 g/dL (ref 6.5–8.1)

## 2023-04-09 NOTE — Progress Notes (Signed)
HEMATOLOGY/ONCOLOGY CLINIC NOTE  Date of Service: 04/09/2023  Patient Care Team: Joycelyn Rua, MD as PCP - General (Family Medicine)  CHIEF COMPLAINTS/PURPOSE OF CONSULTATION:  Recurrent DVT/PE  Hematological/Oncological History 12/2019: Acute PE and right lower extremity DVT after orthopedic surgery for a broken left arm. Treated with Eliquis for 6 months.  09/17/2022-09/18/2022: Presented with dizziness and shortness of breath. Admitted for CTA chest showed acute bilateral lower lobe pulmonary emboli, clot burden moderate, right lower lobe infarct. Evidence of right heart strain consistent with at least submassive PE. Doppler showed acute DVT involving right popliteal vein. Patient was started on IV heparin and transitioned to Eliquis therapy. 10/09/2022: Establish care with [facility]  HISTORY OF PRESENTING ILLNESS:   Julian White 62 y.o. 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 He is unaccompanied for this visit.    Julian White reports that since hospital discharge and continuing on Eliquis therapy, his shortness of breath and chest discomfort has improved. He is tolerating eliquis therapy without any toxicities including bruising or bleeding episodes. He does reports some improvement of right lower extremity edema. He denies any neuropathy, skin discoloration or difficulty with ambulation of the right lower extremity. He has no other complaints. He denies fevers, chills, sweats, cough, nausea, vomiting, diarrhea or constipation.   INTERVAL HISTORY: Julian White is a wonderful 62 y.o. male who is here for continued evaluation and management of Recurrent DVT/PE. Patient was initially seen by PA Thayil on 10/09/2022. He had lab work-up during his initial visit and the results were gone over with the patient by PA Thayil on 10/23/2022.  Patient notes he  has been doing well overall without any new or severe medical concerns since his last visit with PA Thayil. He complains of swallowing problem when eating hard food, which started around a month ago.  Patient notes that when he eats hard food, it feels getting stuck for a while. He denies any previous medical history of acid reflux. He does not have a Solicitor.   Patient notes he had a recent knee surgery in July without any complications.   He notes that his breathing is back to normal. He denies any recent infection issues, fever, chills, night sweats, unexpected weight loss, chest pain, back pain, abdominal pain, or leg swelling. He denies back stool or blood in stool.   Patient has been tolerating his Eliquis dosage well without any new or severe toxicities.   MEDICAL HISTORY:  Past Medical History:  Diagnosis Date   Anginal pain (HCC)    CAD (coronary artery disease)    minimal plaque by LHC 11/13:LHC 06/27/12:  dLM 20%, proximal and mid LAD 20%, EF 55% with normal wall motion     Headache(784.0)    Heart murmur    Hx of echocardiogram    Echo 06/27/12: Mild LVH, EF 65-70%.   Hypertension    per pt dx 20 years but has not had any meds in 20 yrs per md   NSTEMI (non-ST elevated myocardial infarction) (HCC)    05/2012:  Type 2 in setting of SVT (HR 190s)   SVT (supraventricular tachycardia)     SURGICAL HISTORY: Past Surgical History:  Procedure Laterality Date   ABLATION     svt   CARDIAC CATHETERIZATION     COLONOSCOPY     LEFT HEART CATHETERIZATION WITH CORONARY ANGIOGRAM N/A 06/27/2012   Procedure: LEFT  HEART CATHETERIZATION WITH CORONARY ANGIOGRAM;  Surgeon: Dolores Patty, MD;  Location: [facility] CATH LAB;  Service: Cardiovascular;  Laterality: N/A;   ORIF ELBOW FRACTURE Left 01/13/2020   Procedure: OPEN REDUCTION INTERNAL FIXATION (ORIF) ELBOW ulna and radius FRACTURE;  Surgeon: Bradly Bienenstock, MD;  Location: [facility] OR;  Service: Orthopedics;  Laterality: Left;  with  regional block   SUPRAVENTRICULAR TACHYCARDIA ABLATION N/A 07/17/2012   Procedure: SUPRAVENTRICULAR TACHYCARDIA ABLATION;  Surgeon: Marinus Maw, MD;  Location: [facility] CATH LAB;  Service: Cardiovascular;  Laterality: N/A;    SOCIAL HISTORY: Social History   Socioeconomic History   Marital status: Married    Spouse name: Not on file   Number of children: Not on file   Years of education: Not on file   Highest education level: Not on file  Occupational History   Not on file  Tobacco Use   Smoking status: Never   Smokeless tobacco: Never  Substance and Sexual Activity   Alcohol use: No   Drug use: No   Sexual activity: Yes  Other Topics Concern   Not on file  Social History Narrative   Not on file   Social Determinants of Health   Financial Resource Strain: Not on file  Food Insecurity: No Food Insecurity (10/09/2022)   Hunger Vital Sign    Worried About Running Out of Food in the Last Year: Never true    Ran Out of Food in the Last Year: Never true  Transportation Needs: No Transportation Needs (10/09/2022)   PRAPARE - Administrator, Civil Service (Medical): No    Lack of Transportation (Non-Medical): No  Physical Activity: Not on file  Stress: Not on file  Social Connections: Unknown (12/10/2021)   Received from [facility]   Social Network    Social Network: Not on file  Intimate Partner Violence: Not At Risk (10/09/2022)   Humiliation, Afraid, Rape, and Kick questionnaire    Fear of Current or Ex-Partner: No    Emotionally Abused: No    Physically Abused: No    Sexually Abused: No    FAMILY HISTORY: Family History  Problem Relation Age of Onset   Pulmonary embolism Mother    CAD Father        s/p CABG at 29   Deep vein thrombosis Father    Melanoma Father 50 - 18   CAD Brother        s/p MI in his 15s   Pancreatic cancer Paternal Uncle 27 - 79   Pancreatic cancer Paternal Uncle 31 - 79   Pancreatic cancer Paternal Uncle 66 - 39   Cancer  Maternal Grandmother        unknown type   Pancreatic cancer Cousin 80       paternal first cousin    ALLERGIES:  has No Known Allergies.  MEDICATIONS:  Current Outpatient Medications  Medication Sig Dispense Refill   amLODipine-benazepril (LOTREL) 5-10 MG capsule Take 1 capsule by mouth daily. (Patient not taking: Reported on 10/09/2022)     APIXABAN (ELIQUIS) VTE STARTER PACK (10MG  AND 5MG ) Take as directed on package: start with two-5mg  tablets twice daily for 7 days. On day 8, switch to one-5mg  tablet twice daily. 74 tablet 0   rosuvastatin (CRESTOR) 5 MG tablet Take 5 mg by mouth daily.     No current facility-administered medications for this visit.    REVIEW OF SYSTEMS:    10 Point review of Systems was done is negative except as  noted above.  PHYSICAL EXAMINATION: ECOG PERFORMANCE STATUS: 1 - Symptomatic but completely ambulatory  . Vitals:   04/09/23 1221  BP: 120/80  Pulse: 81  Resp: 16  Temp: 97.7 F (36.5 C)  SpO2: 100%   Filed Weights   04/09/23 1221  Weight: 172 lb 12.8 oz (78.4 kg)   .Body mass index is 24.1 kg/m.  GENERAL:alert, in no acute distress and comfortable SKIN: no acute rashes, no significant lesions EYES: conjunctiva are pink and non-injected, sclera anicteric OROPHARYNX: MMM, no exudates, no oropharyngeal erythema or ulceration NECK: supple, no JVD LYMPH:  no palpable lymphadenopathy in the cervical, axillary or inguinal regions LUNGS: clear to auscultation b/l with normal respiratory effort HEART: regular rate & rhythm ABDOMEN:  normoactive bowel sounds , non tender, not distended.  Palpable hepatosplenomegaly Extremity: no pedal edema PSYCH: alert & oriented x 3 with fluent speech NEURO: no focal motor/sensory deficits  LABORATORY DATA:  I have reviewed the data as listed  .    Latest Ref Rng & Units 04/09/2023   12:08 PM 10/09/2022   10:24 AM 09/18/2022    9:28 AM  CBC  WBC 4.0 - 10.5 K/uL 6.0  5.2  6.1   Hemoglobin 13.0 -  17.0 g/dL 60.4  54.0  98.1   Hematocrit 39.0 - 52.0 % 41.5  43.2  39.2   Platelets 150 - 400 K/uL 194  208  280     .    Latest Ref Rng & Units 04/09/2023   12:08 PM 10/09/2022   10:24 AM 09/18/2022    9:28 AM  CMP  Glucose 70 - 99 mg/dL 191  98  478   BUN 6 - 20 mg/dL 17  16  12    Creatinine 0.61 - 1.24 mg/dL 2.95  6.21  3.08   Sodium 135 - 145 mmol/L 139  139  135   Potassium 3.5 - 5.1 mmol/L 4.2  4.3  4.0   Chloride 98 - 111 mmol/L 105  105  103   CO2 22 - 32 mmol/L 31  28  26    Calcium 8.9 - 10.3 mg/dL 9.5  9.8  8.6   Total Protein 6.5 - 8.1 g/dL 7.3  8.3    Total Bilirubin 0.3 - 1.2 mg/dL 1.0  0.9    Alkaline Phos 38 - 126 U/L 88  118    AST 15 - 41 U/L 15  21    ALT 0 - 44 U/L 11  16       RADIOGRAPHIC STUDIES: I have personally reviewed the radiological images as listed and agreed with the findings in the report. No results found.  ASSESSMENT & PLAN:  Julian White is a 62 y.o. 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 Felt to be provoked and treated with eliquis for 6 months.  --Second episode was bilateral PE and RLE in February 2024. No provoking factor identified including prolonged travel/immobility, surgery (particular abdominal or orthropedic), trauma,  and hormone therapy. Currently on long-term Eliquis therapy.   4. Family history of cancer: --Patient reports two paternal uncles were diagnosed with pancreatic cancer and cousin with stomach cancer  PLAN: -Discussed lab results from today, 04/09/2023, with the patient. CBC and CMP are stable.  -Discussed the patient that labs from 10/09/2022 showed heterozygous mutation  of factor 5 leiden mutation and homozygous mutation of PAI gene. -Educated the patient about heterozygous mutation of factor  5 leiden mutation and homozygous mutation of PAI gene in detail.  -Discussed with the patient that these genetic mutations can increase risk of venous thromboembolisms and therefore he will need to continue Eliquis for a long-term.  -Recommend to continue to follow-up with PCP.   5.  Esophageal phase dysphagia -Discussed the option of getting referred to Gastroenterologist regarding swallowing problem and possible needing endoscopy. Patient agrees.  -Will refer the patient to GI.  -Recommend to eat soft food with adequate fluid intake.  FOLLOW-UP: -Referral to [facility] for evaluation of dysphagia and consideration of EGD -RTC with Dr Candise Che in 4 months with labs  The total time spent in the appointment was 32 minutes*.  All of the patient's questions were answered with apparent satisfaction. The patient knows to call the clinic with any problems, questions or concerns.   Wyvonnia Lora MD MS AAHIVMS [facility] Hematology/Oncology Physician [facility]  .*Total Encounter Time as defined by the Centers for Medicare and Medicaid Services includes, in addition to the face-to-face time of a patient visit (documented in the note above) non-face-to-face time: obtaining and reviewing outside history, ordering and reviewing medications, tests or procedures, care coordination (communications with other health care professionals or caregivers) and documentation in the medical record.   04/09/2023 10:51 AM   I,Param Shah,acting as a scribe for Wyvonnia Lora, MD.,have documented all relevant documentation on the behalf of Wyvonnia Lora, MD,as directed by  Wyvonnia Lora, MD while in the presence of Wyvonnia Lora, MD.  .I have reviewed the above documentation for accuracy and completeness, and I agree with the above. Johney Maine MD

## 2023-04-12 ENCOUNTER — Ambulatory Visit: Payer: Federal, State, Local not specified - PPO | Admitting: Hematology

## 2023-04-12 ENCOUNTER — Other Ambulatory Visit: Payer: Federal, State, Local not specified - PPO

## 2023-04-18 ENCOUNTER — Encounter (HOSPITAL_BASED_OUTPATIENT_CLINIC_OR_DEPARTMENT_OTHER): Payer: Self-pay | Admitting: Pulmonary Disease

## 2023-04-18 ENCOUNTER — Ambulatory Visit (HOSPITAL_BASED_OUTPATIENT_CLINIC_OR_DEPARTMENT_OTHER): Payer: Federal, State, Local not specified - PPO | Admitting: Pulmonary Disease

## 2023-04-18 VITALS — BP 128/88 | HR 79 | Ht 71.0 in | Wt 172.0 lb

## 2023-04-18 DIAGNOSIS — Z86711 Personal history of pulmonary embolism: Secondary | ICD-10-CM

## 2023-04-18 DIAGNOSIS — R911 Solitary pulmonary nodule: Secondary | ICD-10-CM | POA: Diagnosis not present

## 2023-04-18 NOTE — Patient Instructions (Signed)
Follow up as needed

## 2023-04-18 NOTE — Progress Notes (Signed)
Julian White, Critical Care, and Sleep Medicine  Chief Complaint  Patient presents with   Medical Management of Chronic Issues    Past Surgical History:  He  has a past surgical history that includes Cardiac catheterization; ABLATION; left heart catheterization with coronary angiogram (N/A, 06/27/2012); supraventricular tachycardia ablation (N/A, 07/17/2012); Colonoscopy; and ORIF elbow fracture (Left, 01/13/2020).  Past Medical History:  CAD, Headache, HTN, SVT, Rt leg DVT June 2021 and February 2024, PE June 2021 and February 2024  Constitutional:  BP 128/88   Pulse 79   Ht 5\' 11"  (1.803 m)   Wt 172 lb (78 kg)   SpO2 98%   BMI 23.99 kg/m   Brief Summary:  Julian White is a 62 y.o. male with recurrent thromboembolic disease.      Subjective:   CT chest was stable.  Not having cough, sputum, chest pain, leg swelling, bruising, or gum bleeding.  Doesn't feel like his breathing limits his activity.  Sleeping well.  Physical Exam:   Appearance - well kempt   ENMT - no sinus tenderness, no oral exudate, no LAN, Mallampati 2 airway, no stridor  Respiratory - equal breath sounds bilaterally, no wheezing or rales  CV - s1s2 regular rate and rhythm, no murmurs  Ext - no clubbing, no edema  Skin - no rashes  Psych - normal mood and affect    White testing:    Chest Imaging:  CT angio chest 01/17/20 >> b/l PE CT angio chest 09/17/22 >> acute PE RLL and LLL, RV:LV 1.17, White infarct RLL, RLL 8 x 6 x 6 mm nodule CT chest 03/07/23 >> 4 mm nodule Rt major fissure stable  Cardiac Tests:  Echo 10/11/22 >> EF 55 to 60%, RVSP 26.4 mmHg  Social History:  He  reports that he has never smoked. He has never used smokeless tobacco. He reports that he does not drink alcohol and does not use drugs.  Family History:  His family history includes CAD in his brother and father; Cancer in his maternal grandmother; Deep vein thrombosis in his father; Melanoma (age  of onset: 61 - 36) in his father; Pancreatic cancer (age of onset: 68) in his cousin; Pancreatic cancer (age of onset: 27 - 52) in his paternal uncle, paternal uncle, and paternal uncle; White embolism in his mother.     Assessment/Plan:   Recurrent thromboembolic disease. - followed by Dr. Wyvonnia Lora with hematology/oncology - has heterozygous mutation for Factor 5 Leiden and homozygous mutation for Plasminogen activator inhibitor - continue indefinite anticoagulation therapy with eliquis; he will get refills from his PCP  Right lower lobe lung nodule. - stable and benign - no additional radiographic follow up needed  Time Spent Involved in Patient Care on Day of Examination:  15 minutes  Follow up:   Patient Instructions  Follow up as needed  Medication List:   Allergies as of 04/18/2023   No Known Allergies      Medication List        Accurate as of April 18, 2023 10:05 AM. If you have any questions, ask your nurse or doctor.          amLODipine-benazepril 5-10 MG capsule Commonly known as: LOTREL Take 1 capsule by mouth daily.   Eliquis 5 MG Tabs tablet Generic drug: apixaban Take 5 mg by mouth 2 (two) times daily. What changed: Another medication with the same name was removed. Continue taking this medication, and follow the directions you see here. Changed  by: Coralyn Helling   rosuvastatin 5 MG tablet Commonly known as: CRESTOR Take 5 mg by mouth daily.        Signature:  Coralyn Helling, MD [facility] White/Critical Care Pager - [phone] 04/18/2023, 10:05 AM

## 2023-06-14 DIAGNOSIS — L810 Postinflammatory hyperpigmentation: Secondary | ICD-10-CM | POA: Diagnosis not present

## 2023-06-14 DIAGNOSIS — L738 Other specified follicular disorders: Secondary | ICD-10-CM | POA: Diagnosis not present

## 2023-06-24 ENCOUNTER — Encounter: Payer: Self-pay | Admitting: Hematology

## 2023-08-12 ENCOUNTER — Ambulatory Visit: Payer: Federal, State, Local not specified - PPO | Admitting: Hematology

## 2023-08-12 ENCOUNTER — Other Ambulatory Visit: Payer: Federal, State, Local not specified - PPO

## 2023-09-17 DIAGNOSIS — R1030 Lower abdominal pain, unspecified: Secondary | ICD-10-CM | POA: Diagnosis not present

## 2023-09-17 DIAGNOSIS — R1319 Other dysphagia: Secondary | ICD-10-CM | POA: Diagnosis not present

## 2023-09-17 DIAGNOSIS — K920 Hematemesis: Secondary | ICD-10-CM | POA: Diagnosis not present

## 2023-10-07 DIAGNOSIS — R1310 Dysphagia, unspecified: Secondary | ICD-10-CM | POA: Diagnosis not present

## 2023-10-07 DIAGNOSIS — K920 Hematemesis: Secondary | ICD-10-CM | POA: Diagnosis not present

## 2023-10-07 DIAGNOSIS — R1084 Generalized abdominal pain: Secondary | ICD-10-CM | POA: Diagnosis not present

## 2023-10-29 DIAGNOSIS — K920 Hematemesis: Secondary | ICD-10-CM | POA: Diagnosis not present

## 2023-10-29 DIAGNOSIS — K2289 Other specified disease of esophagus: Secondary | ICD-10-CM | POA: Diagnosis not present

## 2023-10-29 DIAGNOSIS — K219 Gastro-esophageal reflux disease without esophagitis: Secondary | ICD-10-CM | POA: Diagnosis not present

## 2023-10-29 DIAGNOSIS — K2930 Chronic superficial gastritis without bleeding: Secondary | ICD-10-CM | POA: Diagnosis not present

## 2023-10-29 DIAGNOSIS — R1310 Dysphagia, unspecified: Secondary | ICD-10-CM | POA: Diagnosis not present

## 2023-10-29 DIAGNOSIS — K2970 Gastritis, unspecified, without bleeding: Secondary | ICD-10-CM | POA: Diagnosis not present

## 2023-11-18 DIAGNOSIS — Z86718 Personal history of other venous thrombosis and embolism: Secondary | ICD-10-CM | POA: Diagnosis not present

## 2023-11-18 DIAGNOSIS — I10 Essential (primary) hypertension: Secondary | ICD-10-CM | POA: Diagnosis not present

## 2023-11-18 DIAGNOSIS — Z0000 Encounter for general adult medical examination without abnormal findings: Secondary | ICD-10-CM | POA: Diagnosis not present

## 2023-11-18 DIAGNOSIS — E7800 Pure hypercholesterolemia, unspecified: Secondary | ICD-10-CM | POA: Diagnosis not present

## 2023-12-06 DIAGNOSIS — R1310 Dysphagia, unspecified: Secondary | ICD-10-CM | POA: Diagnosis not present

## 2024-03-17 DIAGNOSIS — M5412 Radiculopathy, cervical region: Secondary | ICD-10-CM | POA: Diagnosis not present
